# Patient Record
Sex: Female | Born: 1940 | Race: White | Hispanic: No | Marital: Married
Health system: Southern US, Community
[De-identification: ages and names within clinical notes are randomized; demographics above are authoritative.]

---

## 2000-06-20 ENCOUNTER — Other Ambulatory Visit: Admission: RE | Admit: 2000-06-20 | Discharge: 2000-06-20 | Payer: Self-pay | Admitting: Obstetrics and Gynecology

## 2000-07-04 ENCOUNTER — Other Ambulatory Visit: Admission: RE | Admit: 2000-07-04 | Discharge: 2000-07-04 | Payer: Self-pay | Admitting: Obstetrics and Gynecology

## 2000-09-20 ENCOUNTER — Other Ambulatory Visit: Admission: RE | Admit: 2000-09-20 | Discharge: 2000-09-20 | Payer: Self-pay | Admitting: Obstetrics and Gynecology

## 2000-12-29 ENCOUNTER — Encounter: Admission: RE | Admit: 2000-12-29 | Discharge: 2000-12-29 | Payer: Self-pay | Admitting: *Deleted

## 2000-12-29 ENCOUNTER — Encounter: Payer: Self-pay | Admitting: *Deleted

## 2001-04-05 ENCOUNTER — Other Ambulatory Visit: Admission: RE | Admit: 2001-04-05 | Discharge: 2001-04-05 | Payer: Self-pay | Admitting: Obstetrics and Gynecology

## 2001-10-15 ENCOUNTER — Other Ambulatory Visit: Admission: RE | Admit: 2001-10-15 | Discharge: 2001-10-15 | Payer: Self-pay | Admitting: Obstetrics and Gynecology

## 2002-04-26 ENCOUNTER — Other Ambulatory Visit: Admission: RE | Admit: 2002-04-26 | Discharge: 2002-04-26 | Payer: Self-pay | Admitting: Obstetrics and Gynecology

## 2002-05-15 ENCOUNTER — Ambulatory Visit (HOSPITAL_COMMUNITY): Admission: RE | Admit: 2002-05-15 | Discharge: 2002-05-15 | Payer: Self-pay | Admitting: Gastroenterology

## 2002-08-14 ENCOUNTER — Ambulatory Visit (HOSPITAL_COMMUNITY): Admission: RE | Admit: 2002-08-14 | Discharge: 2002-08-14 | Payer: Self-pay | Admitting: Gastroenterology

## 2002-10-17 ENCOUNTER — Other Ambulatory Visit: Admission: RE | Admit: 2002-10-17 | Discharge: 2002-10-17 | Payer: Self-pay | Admitting: Obstetrics and Gynecology

## 2004-03-08 ENCOUNTER — Other Ambulatory Visit: Admission: RE | Admit: 2004-03-08 | Discharge: 2004-03-08 | Payer: Self-pay | Admitting: Obstetrics and Gynecology

## 2005-03-10 ENCOUNTER — Other Ambulatory Visit: Admission: RE | Admit: 2005-03-10 | Discharge: 2005-03-10 | Payer: Self-pay | Admitting: Obstetrics and Gynecology

## 2005-05-18 ENCOUNTER — Encounter: Admission: RE | Admit: 2005-05-18 | Discharge: 2005-05-18 | Payer: Self-pay | Admitting: Obstetrics and Gynecology

## 2005-06-06 ENCOUNTER — Encounter: Admission: RE | Admit: 2005-06-06 | Discharge: 2005-06-06 | Payer: Self-pay | Admitting: Family Medicine

## 2005-07-30 ENCOUNTER — Emergency Department (HOSPITAL_COMMUNITY): Admission: EM | Admit: 2005-07-30 | Discharge: 2005-07-30 | Payer: Self-pay | Admitting: Family Medicine

## 2005-07-31 ENCOUNTER — Emergency Department (HOSPITAL_COMMUNITY): Admission: EM | Admit: 2005-07-31 | Discharge: 2005-07-31 | Payer: Self-pay | Admitting: Family Medicine

## 2011-04-21 DIAGNOSIS — L259 Unspecified contact dermatitis, unspecified cause: Secondary | ICD-10-CM | POA: Diagnosis not present

## 2011-05-10 DIAGNOSIS — R1013 Epigastric pain: Secondary | ICD-10-CM | POA: Diagnosis not present

## 2011-05-10 DIAGNOSIS — K5900 Constipation, unspecified: Secondary | ICD-10-CM | POA: Diagnosis not present

## 2011-05-10 DIAGNOSIS — K219 Gastro-esophageal reflux disease without esophagitis: Secondary | ICD-10-CM | POA: Diagnosis not present

## 2011-05-31 DIAGNOSIS — S0180XA Unspecified open wound of other part of head, initial encounter: Secondary | ICD-10-CM | POA: Diagnosis not present

## 2011-05-31 DIAGNOSIS — S0990XA Unspecified injury of head, initial encounter: Secondary | ICD-10-CM | POA: Diagnosis not present

## 2011-06-06 DIAGNOSIS — S1093XA Contusion of unspecified part of neck, initial encounter: Secondary | ICD-10-CM | POA: Diagnosis not present

## 2011-06-06 DIAGNOSIS — S0003XA Contusion of scalp, initial encounter: Secondary | ICD-10-CM | POA: Diagnosis not present

## 2011-06-06 DIAGNOSIS — T148XXA Other injury of unspecified body region, initial encounter: Secondary | ICD-10-CM | POA: Diagnosis not present

## 2011-06-08 DIAGNOSIS — H04129 Dry eye syndrome of unspecified lacrimal gland: Secondary | ICD-10-CM | POA: Diagnosis not present

## 2011-06-08 DIAGNOSIS — H00029 Hordeolum internum unspecified eye, unspecified eyelid: Secondary | ICD-10-CM | POA: Diagnosis not present

## 2011-06-08 DIAGNOSIS — H40029 Open angle with borderline findings, high risk, unspecified eye: Secondary | ICD-10-CM | POA: Diagnosis not present

## 2011-08-08 DIAGNOSIS — I10 Essential (primary) hypertension: Secondary | ICD-10-CM | POA: Diagnosis not present

## 2011-08-08 DIAGNOSIS — E7800 Pure hypercholesterolemia, unspecified: Secondary | ICD-10-CM | POA: Diagnosis not present

## 2011-08-08 DIAGNOSIS — E039 Hypothyroidism, unspecified: Secondary | ICD-10-CM | POA: Diagnosis not present

## 2011-08-26 DIAGNOSIS — R928 Other abnormal and inconclusive findings on diagnostic imaging of breast: Secondary | ICD-10-CM | POA: Diagnosis not present

## 2011-10-07 DIAGNOSIS — D239 Other benign neoplasm of skin, unspecified: Secondary | ICD-10-CM | POA: Diagnosis not present

## 2011-10-07 DIAGNOSIS — L821 Other seborrheic keratosis: Secondary | ICD-10-CM | POA: Diagnosis not present

## 2011-10-07 DIAGNOSIS — L723 Sebaceous cyst: Secondary | ICD-10-CM | POA: Diagnosis not present

## 2011-10-07 DIAGNOSIS — L719 Rosacea, unspecified: Secondary | ICD-10-CM | POA: Diagnosis not present

## 2011-11-15 DIAGNOSIS — K5900 Constipation, unspecified: Secondary | ICD-10-CM | POA: Diagnosis not present

## 2011-11-15 DIAGNOSIS — Z1211 Encounter for screening for malignant neoplasm of colon: Secondary | ICD-10-CM | POA: Diagnosis not present

## 2011-11-15 DIAGNOSIS — K219 Gastro-esophageal reflux disease without esophagitis: Secondary | ICD-10-CM | POA: Diagnosis not present

## 2011-12-22 DIAGNOSIS — H35379 Puckering of macula, unspecified eye: Secondary | ICD-10-CM | POA: Diagnosis not present

## 2011-12-22 DIAGNOSIS — H40019 Open angle with borderline findings, low risk, unspecified eye: Secondary | ICD-10-CM | POA: Diagnosis not present

## 2011-12-22 DIAGNOSIS — H2510 Age-related nuclear cataract, unspecified eye: Secondary | ICD-10-CM | POA: Diagnosis not present

## 2011-12-22 DIAGNOSIS — H04129 Dry eye syndrome of unspecified lacrimal gland: Secondary | ICD-10-CM | POA: Diagnosis not present

## 2012-01-18 DIAGNOSIS — Z86010 Personal history of colonic polyps: Secondary | ICD-10-CM | POA: Diagnosis not present

## 2012-01-18 DIAGNOSIS — Z800 Family history of malignant neoplasm of digestive organs: Secondary | ICD-10-CM | POA: Diagnosis not present

## 2012-01-18 DIAGNOSIS — Z1211 Encounter for screening for malignant neoplasm of colon: Secondary | ICD-10-CM | POA: Diagnosis not present

## 2012-01-18 DIAGNOSIS — D126 Benign neoplasm of colon, unspecified: Secondary | ICD-10-CM | POA: Diagnosis not present

## 2012-02-01 DIAGNOSIS — L259 Unspecified contact dermatitis, unspecified cause: Secondary | ICD-10-CM | POA: Diagnosis not present

## 2012-02-01 DIAGNOSIS — D485 Neoplasm of uncertain behavior of skin: Secondary | ICD-10-CM | POA: Diagnosis not present

## 2012-02-14 DIAGNOSIS — E7800 Pure hypercholesterolemia, unspecified: Secondary | ICD-10-CM | POA: Diagnosis not present

## 2012-02-14 DIAGNOSIS — I10 Essential (primary) hypertension: Secondary | ICD-10-CM | POA: Diagnosis not present

## 2012-02-14 DIAGNOSIS — Z23 Encounter for immunization: Secondary | ICD-10-CM | POA: Diagnosis not present

## 2012-02-14 DIAGNOSIS — E039 Hypothyroidism, unspecified: Secondary | ICD-10-CM | POA: Diagnosis not present

## 2012-02-14 DIAGNOSIS — M810 Age-related osteoporosis without current pathological fracture: Secondary | ICD-10-CM | POA: Diagnosis not present

## 2012-02-14 DIAGNOSIS — J309 Allergic rhinitis, unspecified: Secondary | ICD-10-CM | POA: Diagnosis not present

## 2012-02-14 DIAGNOSIS — D126 Benign neoplasm of colon, unspecified: Secondary | ICD-10-CM | POA: Diagnosis not present

## 2012-02-14 DIAGNOSIS — Z0000 Encounter for general adult medical examination without abnormal findings: Secondary | ICD-10-CM | POA: Diagnosis not present

## 2012-06-08 DIAGNOSIS — H40019 Open angle with borderline findings, low risk, unspecified eye: Secondary | ICD-10-CM | POA: Diagnosis not present

## 2012-06-08 DIAGNOSIS — H04129 Dry eye syndrome of unspecified lacrimal gland: Secondary | ICD-10-CM | POA: Diagnosis not present

## 2012-06-13 DIAGNOSIS — M545 Low back pain, unspecified: Secondary | ICD-10-CM | POA: Diagnosis not present

## 2012-06-13 DIAGNOSIS — E039 Hypothyroidism, unspecified: Secondary | ICD-10-CM | POA: Diagnosis not present

## 2012-06-13 DIAGNOSIS — E7800 Pure hypercholesterolemia, unspecified: Secondary | ICD-10-CM | POA: Diagnosis not present

## 2012-06-13 DIAGNOSIS — K120 Recurrent oral aphthae: Secondary | ICD-10-CM | POA: Diagnosis not present

## 2012-06-13 DIAGNOSIS — I10 Essential (primary) hypertension: Secondary | ICD-10-CM | POA: Diagnosis not present

## 2012-08-14 DIAGNOSIS — I10 Essential (primary) hypertension: Secondary | ICD-10-CM | POA: Diagnosis not present

## 2012-08-14 DIAGNOSIS — E039 Hypothyroidism, unspecified: Secondary | ICD-10-CM | POA: Diagnosis not present

## 2012-08-14 DIAGNOSIS — E7800 Pure hypercholesterolemia, unspecified: Secondary | ICD-10-CM | POA: Diagnosis not present

## 2012-08-29 DIAGNOSIS — Z1231 Encounter for screening mammogram for malignant neoplasm of breast: Secondary | ICD-10-CM | POA: Diagnosis not present

## 2012-09-03 DIAGNOSIS — L259 Unspecified contact dermatitis, unspecified cause: Secondary | ICD-10-CM | POA: Diagnosis not present

## 2012-09-10 DIAGNOSIS — L719 Rosacea, unspecified: Secondary | ICD-10-CM | POA: Diagnosis not present

## 2012-11-14 DIAGNOSIS — L719 Rosacea, unspecified: Secondary | ICD-10-CM | POA: Diagnosis not present

## 2012-11-14 DIAGNOSIS — L821 Other seborrheic keratosis: Secondary | ICD-10-CM | POA: Diagnosis not present

## 2012-11-14 DIAGNOSIS — D239 Other benign neoplasm of skin, unspecified: Secondary | ICD-10-CM | POA: Diagnosis not present

## 2012-11-14 DIAGNOSIS — L723 Sebaceous cyst: Secondary | ICD-10-CM | POA: Diagnosis not present

## 2012-11-14 DIAGNOSIS — D2370 Other benign neoplasm of skin of unspecified lower limb, including hip: Secondary | ICD-10-CM | POA: Diagnosis not present

## 2012-12-13 DIAGNOSIS — K219 Gastro-esophageal reflux disease without esophagitis: Secondary | ICD-10-CM | POA: Diagnosis not present

## 2012-12-13 DIAGNOSIS — K5900 Constipation, unspecified: Secondary | ICD-10-CM | POA: Diagnosis not present

## 2012-12-31 DIAGNOSIS — H04129 Dry eye syndrome of unspecified lacrimal gland: Secondary | ICD-10-CM | POA: Diagnosis not present

## 2012-12-31 DIAGNOSIS — D492 Neoplasm of unspecified behavior of bone, soft tissue, and skin: Secondary | ICD-10-CM | POA: Diagnosis not present

## 2012-12-31 DIAGNOSIS — H2510 Age-related nuclear cataract, unspecified eye: Secondary | ICD-10-CM | POA: Diagnosis not present

## 2012-12-31 DIAGNOSIS — H35379 Puckering of macula, unspecified eye: Secondary | ICD-10-CM | POA: Diagnosis not present

## 2012-12-31 DIAGNOSIS — H524 Presbyopia: Secondary | ICD-10-CM | POA: Diagnosis not present

## 2012-12-31 DIAGNOSIS — H40029 Open angle with borderline findings, high risk, unspecified eye: Secondary | ICD-10-CM | POA: Diagnosis not present

## 2012-12-31 DIAGNOSIS — H43819 Vitreous degeneration, unspecified eye: Secondary | ICD-10-CM | POA: Diagnosis not present

## 2013-01-15 DIAGNOSIS — Z23 Encounter for immunization: Secondary | ICD-10-CM | POA: Diagnosis not present

## 2013-01-31 DIAGNOSIS — R6889 Other general symptoms and signs: Secondary | ICD-10-CM | POA: Diagnosis not present

## 2013-01-31 DIAGNOSIS — R1310 Dysphagia, unspecified: Secondary | ICD-10-CM | POA: Diagnosis not present

## 2013-02-05 DIAGNOSIS — J3501 Chronic tonsillitis: Secondary | ICD-10-CM | POA: Diagnosis not present

## 2013-02-05 DIAGNOSIS — K219 Gastro-esophageal reflux disease without esophagitis: Secondary | ICD-10-CM | POA: Diagnosis not present

## 2013-02-11 DIAGNOSIS — M461 Sacroiliitis, not elsewhere classified: Secondary | ICD-10-CM | POA: Diagnosis not present

## 2013-02-20 DIAGNOSIS — I10 Essential (primary) hypertension: Secondary | ICD-10-CM | POA: Diagnosis not present

## 2013-02-20 DIAGNOSIS — D126 Benign neoplasm of colon, unspecified: Secondary | ICD-10-CM | POA: Diagnosis not present

## 2013-02-20 DIAGNOSIS — M810 Age-related osteoporosis without current pathological fracture: Secondary | ICD-10-CM | POA: Diagnosis not present

## 2013-02-20 DIAGNOSIS — E039 Hypothyroidism, unspecified: Secondary | ICD-10-CM | POA: Diagnosis not present

## 2013-02-20 DIAGNOSIS — J309 Allergic rhinitis, unspecified: Secondary | ICD-10-CM | POA: Diagnosis not present

## 2013-02-20 DIAGNOSIS — Z0000 Encounter for general adult medical examination without abnormal findings: Secondary | ICD-10-CM | POA: Diagnosis not present

## 2013-02-20 DIAGNOSIS — M25559 Pain in unspecified hip: Secondary | ICD-10-CM | POA: Diagnosis not present

## 2013-02-20 DIAGNOSIS — E7800 Pure hypercholesterolemia, unspecified: Secondary | ICD-10-CM | POA: Diagnosis not present

## 2013-03-05 DIAGNOSIS — M76899 Other specified enthesopathies of unspecified lower limb, excluding foot: Secondary | ICD-10-CM | POA: Diagnosis not present

## 2013-03-27 DIAGNOSIS — M810 Age-related osteoporosis without current pathological fracture: Secondary | ICD-10-CM | POA: Diagnosis not present

## 2013-05-14 ENCOUNTER — Other Ambulatory Visit: Payer: Self-pay | Admitting: Dermatology

## 2013-05-14 DIAGNOSIS — D485 Neoplasm of uncertain behavior of skin: Secondary | ICD-10-CM | POA: Diagnosis not present

## 2013-05-14 DIAGNOSIS — L723 Sebaceous cyst: Secondary | ICD-10-CM | POA: Diagnosis not present

## 2013-05-30 DIAGNOSIS — H40029 Open angle with borderline findings, high risk, unspecified eye: Secondary | ICD-10-CM | POA: Diagnosis not present

## 2013-08-07 DIAGNOSIS — K219 Gastro-esophageal reflux disease without esophagitis: Secondary | ICD-10-CM | POA: Diagnosis not present

## 2013-08-07 DIAGNOSIS — M949 Disorder of cartilage, unspecified: Secondary | ICD-10-CM | POA: Diagnosis not present

## 2013-08-07 DIAGNOSIS — M899 Disorder of bone, unspecified: Secondary | ICD-10-CM | POA: Diagnosis not present

## 2013-08-09 DIAGNOSIS — M899 Disorder of bone, unspecified: Secondary | ICD-10-CM | POA: Diagnosis not present

## 2013-08-09 DIAGNOSIS — T148XXA Other injury of unspecified body region, initial encounter: Secondary | ICD-10-CM | POA: Diagnosis not present

## 2013-08-15 ENCOUNTER — Ambulatory Visit: Payer: Medicare Other | Attending: Internal Medicine | Admitting: Physical Therapy

## 2013-08-15 DIAGNOSIS — IMO0001 Reserved for inherently not codable concepts without codable children: Secondary | ICD-10-CM | POA: Insufficient documentation

## 2013-08-15 DIAGNOSIS — M545 Low back pain, unspecified: Secondary | ICD-10-CM | POA: Diagnosis not present

## 2013-08-15 DIAGNOSIS — R5381 Other malaise: Secondary | ICD-10-CM | POA: Insufficient documentation

## 2013-08-21 DIAGNOSIS — E7800 Pure hypercholesterolemia, unspecified: Secondary | ICD-10-CM | POA: Diagnosis not present

## 2013-08-21 DIAGNOSIS — M810 Age-related osteoporosis without current pathological fracture: Secondary | ICD-10-CM | POA: Diagnosis not present

## 2013-08-21 DIAGNOSIS — K219 Gastro-esophageal reflux disease without esophagitis: Secondary | ICD-10-CM | POA: Diagnosis not present

## 2013-08-21 DIAGNOSIS — I10 Essential (primary) hypertension: Secondary | ICD-10-CM | POA: Diagnosis not present

## 2013-08-21 DIAGNOSIS — E039 Hypothyroidism, unspecified: Secondary | ICD-10-CM | POA: Diagnosis not present

## 2013-08-22 ENCOUNTER — Ambulatory Visit: Payer: Medicare Other | Attending: Internal Medicine | Admitting: Physical Therapy

## 2013-08-22 DIAGNOSIS — M545 Low back pain, unspecified: Secondary | ICD-10-CM | POA: Diagnosis not present

## 2013-08-22 DIAGNOSIS — R5381 Other malaise: Secondary | ICD-10-CM | POA: Diagnosis not present

## 2013-08-22 DIAGNOSIS — IMO0001 Reserved for inherently not codable concepts without codable children: Secondary | ICD-10-CM | POA: Diagnosis not present

## 2013-08-28 ENCOUNTER — Encounter (HOSPITAL_COMMUNITY): Payer: Self-pay

## 2013-08-29 ENCOUNTER — Ambulatory Visit: Payer: Medicare Other | Admitting: Physical Therapy

## 2013-08-29 DIAGNOSIS — M545 Low back pain, unspecified: Secondary | ICD-10-CM | POA: Diagnosis not present

## 2013-08-29 DIAGNOSIS — R5381 Other malaise: Secondary | ICD-10-CM | POA: Diagnosis not present

## 2013-08-29 DIAGNOSIS — IMO0001 Reserved for inherently not codable concepts without codable children: Secondary | ICD-10-CM | POA: Diagnosis not present

## 2013-09-04 DIAGNOSIS — Z1231 Encounter for screening mammogram for malignant neoplasm of breast: Secondary | ICD-10-CM | POA: Diagnosis not present

## 2013-09-05 ENCOUNTER — Ambulatory Visit: Payer: Medicare Other | Admitting: Physical Therapy

## 2013-09-05 DIAGNOSIS — M545 Low back pain, unspecified: Secondary | ICD-10-CM | POA: Diagnosis not present

## 2013-09-05 DIAGNOSIS — IMO0001 Reserved for inherently not codable concepts without codable children: Secondary | ICD-10-CM | POA: Diagnosis not present

## 2013-09-05 DIAGNOSIS — R5381 Other malaise: Secondary | ICD-10-CM | POA: Diagnosis not present

## 2013-11-26 DIAGNOSIS — H40029 Open angle with borderline findings, high risk, unspecified eye: Secondary | ICD-10-CM | POA: Diagnosis not present

## 2013-11-28 DIAGNOSIS — D239 Other benign neoplasm of skin, unspecified: Secondary | ICD-10-CM | POA: Diagnosis not present

## 2013-11-28 DIAGNOSIS — D485 Neoplasm of uncertain behavior of skin: Secondary | ICD-10-CM | POA: Diagnosis not present

## 2013-11-28 DIAGNOSIS — L821 Other seborrheic keratosis: Secondary | ICD-10-CM | POA: Diagnosis not present

## 2014-01-01 DIAGNOSIS — H04123 Dry eye syndrome of bilateral lacrimal glands: Secondary | ICD-10-CM | POA: Diagnosis not present

## 2014-01-01 DIAGNOSIS — H524 Presbyopia: Secondary | ICD-10-CM | POA: Diagnosis not present

## 2014-01-01 DIAGNOSIS — H2513 Age-related nuclear cataract, bilateral: Secondary | ICD-10-CM | POA: Diagnosis not present

## 2014-01-01 DIAGNOSIS — H40023 Open angle with borderline findings, high risk, bilateral: Secondary | ICD-10-CM | POA: Diagnosis not present

## 2014-01-02 ENCOUNTER — Other Ambulatory Visit: Payer: Self-pay | Admitting: Dermatology

## 2014-01-02 DIAGNOSIS — D2371 Other benign neoplasm of skin of right lower limb, including hip: Secondary | ICD-10-CM | POA: Diagnosis not present

## 2014-01-02 DIAGNOSIS — D489 Neoplasm of uncertain behavior, unspecified: Secondary | ICD-10-CM | POA: Diagnosis not present

## 2014-03-06 DIAGNOSIS — Z0000 Encounter for general adult medical examination without abnormal findings: Secondary | ICD-10-CM | POA: Diagnosis not present

## 2014-03-06 DIAGNOSIS — M810 Age-related osteoporosis without current pathological fracture: Secondary | ICD-10-CM | POA: Diagnosis not present

## 2014-03-06 DIAGNOSIS — E039 Hypothyroidism, unspecified: Secondary | ICD-10-CM | POA: Diagnosis not present

## 2014-03-06 DIAGNOSIS — I10 Essential (primary) hypertension: Secondary | ICD-10-CM | POA: Diagnosis not present

## 2014-03-06 DIAGNOSIS — E780 Pure hypercholesterolemia: Secondary | ICD-10-CM | POA: Diagnosis not present

## 2014-03-06 DIAGNOSIS — Z1389 Encounter for screening for other disorder: Secondary | ICD-10-CM | POA: Diagnosis not present

## 2014-03-06 DIAGNOSIS — K219 Gastro-esophageal reflux disease without esophagitis: Secondary | ICD-10-CM | POA: Diagnosis not present

## 2014-03-06 DIAGNOSIS — Z23 Encounter for immunization: Secondary | ICD-10-CM | POA: Diagnosis not present

## 2014-03-06 DIAGNOSIS — K635 Polyp of colon: Secondary | ICD-10-CM | POA: Diagnosis not present

## 2014-03-06 DIAGNOSIS — J309 Allergic rhinitis, unspecified: Secondary | ICD-10-CM | POA: Diagnosis not present

## 2014-08-11 DIAGNOSIS — K219 Gastro-esophageal reflux disease without esophagitis: Secondary | ICD-10-CM | POA: Diagnosis not present

## 2014-08-11 DIAGNOSIS — M859 Disorder of bone density and structure, unspecified: Secondary | ICD-10-CM | POA: Diagnosis not present

## 2014-09-02 DIAGNOSIS — I10 Essential (primary) hypertension: Secondary | ICD-10-CM | POA: Diagnosis not present

## 2014-09-02 DIAGNOSIS — M810 Age-related osteoporosis without current pathological fracture: Secondary | ICD-10-CM | POA: Diagnosis not present

## 2014-09-02 DIAGNOSIS — E780 Pure hypercholesterolemia: Secondary | ICD-10-CM | POA: Diagnosis not present

## 2014-09-02 DIAGNOSIS — E039 Hypothyroidism, unspecified: Secondary | ICD-10-CM | POA: Diagnosis not present

## 2014-09-29 DIAGNOSIS — Z1231 Encounter for screening mammogram for malignant neoplasm of breast: Secondary | ICD-10-CM | POA: Diagnosis not present

## 2014-12-04 DIAGNOSIS — H40023 Open angle with borderline findings, high risk, bilateral: Secondary | ICD-10-CM | POA: Diagnosis not present

## 2014-12-12 DIAGNOSIS — D1722 Benign lipomatous neoplasm of skin and subcutaneous tissue of left arm: Secondary | ICD-10-CM | POA: Diagnosis not present

## 2014-12-12 DIAGNOSIS — L570 Actinic keratosis: Secondary | ICD-10-CM | POA: Diagnosis not present

## 2014-12-12 DIAGNOSIS — L719 Rosacea, unspecified: Secondary | ICD-10-CM | POA: Diagnosis not present

## 2014-12-12 DIAGNOSIS — L821 Other seborrheic keratosis: Secondary | ICD-10-CM | POA: Diagnosis not present

## 2014-12-12 DIAGNOSIS — D2261 Melanocytic nevi of right upper limb, including shoulder: Secondary | ICD-10-CM | POA: Diagnosis not present

## 2015-01-06 DIAGNOSIS — H2513 Age-related nuclear cataract, bilateral: Secondary | ICD-10-CM | POA: Diagnosis not present

## 2015-01-06 DIAGNOSIS — H35373 Puckering of macula, bilateral: Secondary | ICD-10-CM | POA: Diagnosis not present

## 2015-01-06 DIAGNOSIS — H40023 Open angle with borderline findings, high risk, bilateral: Secondary | ICD-10-CM | POA: Diagnosis not present

## 2015-01-06 DIAGNOSIS — H04123 Dry eye syndrome of bilateral lacrimal glands: Secondary | ICD-10-CM | POA: Diagnosis not present

## 2015-03-11 DIAGNOSIS — E7800 Pure hypercholesterolemia, unspecified: Secondary | ICD-10-CM | POA: Diagnosis not present

## 2015-03-11 DIAGNOSIS — M810 Age-related osteoporosis without current pathological fracture: Secondary | ICD-10-CM | POA: Diagnosis not present

## 2015-03-11 DIAGNOSIS — J309 Allergic rhinitis, unspecified: Secondary | ICD-10-CM | POA: Diagnosis not present

## 2015-03-11 DIAGNOSIS — E039 Hypothyroidism, unspecified: Secondary | ICD-10-CM | POA: Diagnosis not present

## 2015-03-11 DIAGNOSIS — Z0000 Encounter for general adult medical examination without abnormal findings: Secondary | ICD-10-CM | POA: Diagnosis not present

## 2015-03-11 DIAGNOSIS — L719 Rosacea, unspecified: Secondary | ICD-10-CM | POA: Diagnosis not present

## 2015-03-11 DIAGNOSIS — I10 Essential (primary) hypertension: Secondary | ICD-10-CM | POA: Diagnosis not present

## 2015-03-11 DIAGNOSIS — K219 Gastro-esophageal reflux disease without esophagitis: Secondary | ICD-10-CM | POA: Diagnosis not present

## 2015-03-11 DIAGNOSIS — Z23 Encounter for immunization: Secondary | ICD-10-CM | POA: Diagnosis not present

## 2015-03-11 DIAGNOSIS — Z1389 Encounter for screening for other disorder: Secondary | ICD-10-CM | POA: Diagnosis not present

## 2015-04-08 DIAGNOSIS — M8589 Other specified disorders of bone density and structure, multiple sites: Secondary | ICD-10-CM | POA: Diagnosis not present

## 2015-04-08 DIAGNOSIS — M859 Disorder of bone density and structure, unspecified: Secondary | ICD-10-CM | POA: Diagnosis not present

## 2015-04-17 DIAGNOSIS — M8580 Other specified disorders of bone density and structure, unspecified site: Secondary | ICD-10-CM | POA: Diagnosis not present

## 2015-06-04 DIAGNOSIS — R194 Change in bowel habit: Secondary | ICD-10-CM | POA: Diagnosis not present

## 2015-06-04 DIAGNOSIS — Z800 Family history of malignant neoplasm of digestive organs: Secondary | ICD-10-CM | POA: Diagnosis not present

## 2015-06-04 DIAGNOSIS — Z1211 Encounter for screening for malignant neoplasm of colon: Secondary | ICD-10-CM | POA: Diagnosis not present

## 2015-06-08 DIAGNOSIS — K635 Polyp of colon: Secondary | ICD-10-CM | POA: Diagnosis not present

## 2015-06-08 DIAGNOSIS — D120 Benign neoplasm of cecum: Secondary | ICD-10-CM | POA: Diagnosis not present

## 2015-06-08 DIAGNOSIS — Z1211 Encounter for screening for malignant neoplasm of colon: Secondary | ICD-10-CM | POA: Diagnosis not present

## 2015-06-08 DIAGNOSIS — R194 Change in bowel habit: Secondary | ICD-10-CM | POA: Diagnosis not present

## 2015-06-25 DIAGNOSIS — R194 Change in bowel habit: Secondary | ICD-10-CM | POA: Diagnosis not present

## 2015-06-25 DIAGNOSIS — K219 Gastro-esophageal reflux disease without esophagitis: Secondary | ICD-10-CM | POA: Diagnosis not present

## 2015-06-25 DIAGNOSIS — Z86010 Personal history of colonic polyps: Secondary | ICD-10-CM | POA: Diagnosis not present

## 2015-08-27 DIAGNOSIS — L820 Inflamed seborrheic keratosis: Secondary | ICD-10-CM | POA: Diagnosis not present

## 2015-08-27 DIAGNOSIS — M25559 Pain in unspecified hip: Secondary | ICD-10-CM | POA: Diagnosis not present

## 2015-08-27 DIAGNOSIS — L821 Other seborrheic keratosis: Secondary | ICD-10-CM | POA: Diagnosis not present

## 2015-08-27 DIAGNOSIS — I781 Nevus, non-neoplastic: Secondary | ICD-10-CM | POA: Diagnosis not present

## 2015-08-27 DIAGNOSIS — D485 Neoplasm of uncertain behavior of skin: Secondary | ICD-10-CM | POA: Diagnosis not present

## 2015-08-31 DIAGNOSIS — M25552 Pain in left hip: Secondary | ICD-10-CM | POA: Diagnosis not present

## 2015-09-15 DIAGNOSIS — E7800 Pure hypercholesterolemia, unspecified: Secondary | ICD-10-CM | POA: Diagnosis not present

## 2015-09-15 DIAGNOSIS — E039 Hypothyroidism, unspecified: Secondary | ICD-10-CM | POA: Diagnosis not present

## 2015-09-15 DIAGNOSIS — M7070 Other bursitis of hip, unspecified hip: Secondary | ICD-10-CM | POA: Diagnosis not present

## 2015-09-15 DIAGNOSIS — I10 Essential (primary) hypertension: Secondary | ICD-10-CM | POA: Diagnosis not present

## 2015-09-28 DIAGNOSIS — M25552 Pain in left hip: Secondary | ICD-10-CM | POA: Diagnosis not present

## 2015-09-30 DIAGNOSIS — Z1231 Encounter for screening mammogram for malignant neoplasm of breast: Secondary | ICD-10-CM | POA: Diagnosis not present

## 2015-10-15 DIAGNOSIS — M25552 Pain in left hip: Secondary | ICD-10-CM | POA: Diagnosis not present

## 2015-10-29 DIAGNOSIS — M25552 Pain in left hip: Secondary | ICD-10-CM | POA: Diagnosis not present

## 2015-11-12 DIAGNOSIS — M25552 Pain in left hip: Secondary | ICD-10-CM | POA: Diagnosis not present

## 2015-12-17 DIAGNOSIS — L719 Rosacea, unspecified: Secondary | ICD-10-CM | POA: Diagnosis not present

## 2015-12-17 DIAGNOSIS — L821 Other seborrheic keratosis: Secondary | ICD-10-CM | POA: Diagnosis not present

## 2015-12-17 DIAGNOSIS — D2261 Melanocytic nevi of right upper limb, including shoulder: Secondary | ICD-10-CM | POA: Diagnosis not present

## 2015-12-17 DIAGNOSIS — D2262 Melanocytic nevi of left upper limb, including shoulder: Secondary | ICD-10-CM | POA: Diagnosis not present

## 2015-12-17 DIAGNOSIS — Z23 Encounter for immunization: Secondary | ICD-10-CM | POA: Diagnosis not present

## 2015-12-17 DIAGNOSIS — L820 Inflamed seborrheic keratosis: Secondary | ICD-10-CM | POA: Diagnosis not present

## 2016-01-14 DIAGNOSIS — H40023 Open angle with borderline findings, high risk, bilateral: Secondary | ICD-10-CM | POA: Diagnosis not present

## 2016-01-14 DIAGNOSIS — H04123 Dry eye syndrome of bilateral lacrimal glands: Secondary | ICD-10-CM | POA: Diagnosis not present

## 2016-01-14 DIAGNOSIS — H35373 Puckering of macula, bilateral: Secondary | ICD-10-CM | POA: Diagnosis not present

## 2016-01-14 DIAGNOSIS — H2513 Age-related nuclear cataract, bilateral: Secondary | ICD-10-CM | POA: Diagnosis not present

## 2016-01-14 DIAGNOSIS — H43822 Vitreomacular adhesion, left eye: Secondary | ICD-10-CM | POA: Diagnosis not present

## 2016-01-22 DIAGNOSIS — Z23 Encounter for immunization: Secondary | ICD-10-CM | POA: Diagnosis not present

## 2016-02-09 DIAGNOSIS — M25552 Pain in left hip: Secondary | ICD-10-CM | POA: Diagnosis not present

## 2016-03-11 DIAGNOSIS — R42 Dizziness and giddiness: Secondary | ICD-10-CM | POA: Diagnosis not present

## 2016-03-24 DIAGNOSIS — E039 Hypothyroidism, unspecified: Secondary | ICD-10-CM | POA: Diagnosis not present

## 2016-03-24 DIAGNOSIS — J309 Allergic rhinitis, unspecified: Secondary | ICD-10-CM | POA: Diagnosis not present

## 2016-03-24 DIAGNOSIS — M810 Age-related osteoporosis without current pathological fracture: Secondary | ICD-10-CM | POA: Diagnosis not present

## 2016-03-24 DIAGNOSIS — Z0000 Encounter for general adult medical examination without abnormal findings: Secondary | ICD-10-CM | POA: Diagnosis not present

## 2016-03-24 DIAGNOSIS — I10 Essential (primary) hypertension: Secondary | ICD-10-CM | POA: Diagnosis not present

## 2016-03-24 DIAGNOSIS — K219 Gastro-esophageal reflux disease without esophagitis: Secondary | ICD-10-CM | POA: Diagnosis not present

## 2016-03-24 DIAGNOSIS — L719 Rosacea, unspecified: Secondary | ICD-10-CM | POA: Diagnosis not present

## 2016-03-24 DIAGNOSIS — E7800 Pure hypercholesterolemia, unspecified: Secondary | ICD-10-CM | POA: Diagnosis not present

## 2016-03-24 DIAGNOSIS — R42 Dizziness and giddiness: Secondary | ICD-10-CM | POA: Diagnosis not present

## 2016-04-25 DIAGNOSIS — J069 Acute upper respiratory infection, unspecified: Secondary | ICD-10-CM | POA: Diagnosis not present

## 2016-04-25 DIAGNOSIS — R509 Fever, unspecified: Secondary | ICD-10-CM | POA: Diagnosis not present

## 2016-04-25 DIAGNOSIS — R05 Cough: Secondary | ICD-10-CM | POA: Diagnosis not present

## 2016-05-04 DIAGNOSIS — M8580 Other specified disorders of bone density and structure, unspecified site: Secondary | ICD-10-CM | POA: Diagnosis not present

## 2016-05-12 DIAGNOSIS — M5430 Sciatica, unspecified side: Secondary | ICD-10-CM | POA: Diagnosis not present

## 2016-05-12 DIAGNOSIS — E7800 Pure hypercholesterolemia, unspecified: Secondary | ICD-10-CM | POA: Diagnosis not present

## 2016-06-14 DIAGNOSIS — Z86010 Personal history of colonic polyps: Secondary | ICD-10-CM | POA: Diagnosis not present

## 2016-06-14 DIAGNOSIS — Z1211 Encounter for screening for malignant neoplasm of colon: Secondary | ICD-10-CM | POA: Diagnosis not present

## 2016-06-14 DIAGNOSIS — Z800 Family history of malignant neoplasm of digestive organs: Secondary | ICD-10-CM | POA: Diagnosis not present

## 2016-06-14 DIAGNOSIS — K5901 Slow transit constipation: Secondary | ICD-10-CM | POA: Diagnosis not present

## 2016-07-13 DIAGNOSIS — D122 Benign neoplasm of ascending colon: Secondary | ICD-10-CM | POA: Diagnosis not present

## 2016-07-13 DIAGNOSIS — Z86010 Personal history of colonic polyps: Secondary | ICD-10-CM | POA: Diagnosis not present

## 2016-07-13 DIAGNOSIS — Z1211 Encounter for screening for malignant neoplasm of colon: Secondary | ICD-10-CM | POA: Diagnosis not present

## 2016-07-13 DIAGNOSIS — K635 Polyp of colon: Secondary | ICD-10-CM | POA: Diagnosis not present

## 2016-07-13 DIAGNOSIS — K6389 Other specified diseases of intestine: Secondary | ICD-10-CM | POA: Diagnosis not present

## 2016-07-19 DIAGNOSIS — H40023 Open angle with borderline findings, high risk, bilateral: Secondary | ICD-10-CM | POA: Diagnosis not present

## 2016-07-19 DIAGNOSIS — H04123 Dry eye syndrome of bilateral lacrimal glands: Secondary | ICD-10-CM | POA: Diagnosis not present

## 2016-10-05 ENCOUNTER — Other Ambulatory Visit: Payer: Self-pay | Admitting: Family Medicine

## 2016-10-05 DIAGNOSIS — I10 Essential (primary) hypertension: Secondary | ICD-10-CM | POA: Diagnosis not present

## 2016-10-05 DIAGNOSIS — R42 Dizziness and giddiness: Secondary | ICD-10-CM | POA: Diagnosis not present

## 2016-10-05 DIAGNOSIS — E039 Hypothyroidism, unspecified: Secondary | ICD-10-CM | POA: Diagnosis not present

## 2016-10-05 DIAGNOSIS — Z1231 Encounter for screening mammogram for malignant neoplasm of breast: Secondary | ICD-10-CM | POA: Diagnosis not present

## 2016-10-05 DIAGNOSIS — E7800 Pure hypercholesterolemia, unspecified: Secondary | ICD-10-CM | POA: Diagnosis not present

## 2016-10-10 ENCOUNTER — Ambulatory Visit
Admission: RE | Admit: 2016-10-10 | Discharge: 2016-10-10 | Disposition: A | Discharge status: Home / Self Care | Payer: Medicare Other | Source: Ambulatory Visit | Attending: Family Medicine | Admitting: Family Medicine

## 2016-10-10 DIAGNOSIS — I10 Essential (primary) hypertension: Secondary | ICD-10-CM | POA: Diagnosis not present

## 2016-10-10 DIAGNOSIS — R42 Dizziness and giddiness: Secondary | ICD-10-CM

## 2016-11-01 DIAGNOSIS — Z7289 Other problems related to lifestyle: Secondary | ICD-10-CM | POA: Diagnosis not present

## 2016-11-01 DIAGNOSIS — H903 Sensorineural hearing loss, bilateral: Secondary | ICD-10-CM | POA: Diagnosis not present

## 2016-11-01 DIAGNOSIS — R42 Dizziness and giddiness: Secondary | ICD-10-CM | POA: Diagnosis not present

## 2016-12-27 DIAGNOSIS — Z23 Encounter for immunization: Secondary | ICD-10-CM | POA: Diagnosis not present

## 2016-12-27 DIAGNOSIS — D2261 Melanocytic nevi of right upper limb, including shoulder: Secondary | ICD-10-CM | POA: Diagnosis not present

## 2016-12-27 DIAGNOSIS — L821 Other seborrheic keratosis: Secondary | ICD-10-CM | POA: Diagnosis not present

## 2016-12-27 DIAGNOSIS — L820 Inflamed seborrheic keratosis: Secondary | ICD-10-CM | POA: Diagnosis not present

## 2016-12-27 DIAGNOSIS — D1722 Benign lipomatous neoplasm of skin and subcutaneous tissue of left arm: Secondary | ICD-10-CM | POA: Diagnosis not present

## 2017-01-13 DIAGNOSIS — R110 Nausea: Secondary | ICD-10-CM | POA: Diagnosis not present

## 2017-01-17 DIAGNOSIS — K219 Gastro-esophageal reflux disease without esophagitis: Secondary | ICD-10-CM | POA: Diagnosis not present

## 2017-01-17 DIAGNOSIS — Z800 Family history of malignant neoplasm of digestive organs: Secondary | ICD-10-CM | POA: Diagnosis not present

## 2017-01-17 DIAGNOSIS — K921 Melena: Secondary | ICD-10-CM | POA: Diagnosis not present

## 2017-01-17 DIAGNOSIS — Z86010 Personal history of colonic polyps: Secondary | ICD-10-CM | POA: Diagnosis not present

## 2017-01-17 DIAGNOSIS — R1013 Epigastric pain: Secondary | ICD-10-CM | POA: Diagnosis not present

## 2017-01-18 DIAGNOSIS — R1013 Epigastric pain: Secondary | ICD-10-CM | POA: Diagnosis not present

## 2017-01-18 DIAGNOSIS — K269 Duodenal ulcer, unspecified as acute or chronic, without hemorrhage or perforation: Secondary | ICD-10-CM | POA: Diagnosis not present

## 2017-01-18 DIAGNOSIS — K259 Gastric ulcer, unspecified as acute or chronic, without hemorrhage or perforation: Secondary | ICD-10-CM | POA: Diagnosis not present

## 2017-01-18 DIAGNOSIS — K319 Disease of stomach and duodenum, unspecified: Secondary | ICD-10-CM | POA: Diagnosis not present

## 2017-01-18 DIAGNOSIS — K921 Melena: Secondary | ICD-10-CM | POA: Diagnosis not present

## 2017-01-18 DIAGNOSIS — K219 Gastro-esophageal reflux disease without esophagitis: Secondary | ICD-10-CM | POA: Diagnosis not present

## 2017-01-31 DIAGNOSIS — H40023 Open angle with borderline findings, high risk, bilateral: Secondary | ICD-10-CM | POA: Diagnosis not present

## 2017-01-31 DIAGNOSIS — H43822 Vitreomacular adhesion, left eye: Secondary | ICD-10-CM | POA: Diagnosis not present

## 2017-01-31 DIAGNOSIS — H04123 Dry eye syndrome of bilateral lacrimal glands: Secondary | ICD-10-CM | POA: Diagnosis not present

## 2017-01-31 DIAGNOSIS — H2513 Age-related nuclear cataract, bilateral: Secondary | ICD-10-CM | POA: Diagnosis not present

## 2017-02-02 DIAGNOSIS — K219 Gastro-esophageal reflux disease without esophagitis: Secondary | ICD-10-CM | POA: Diagnosis not present

## 2017-02-02 DIAGNOSIS — Z86010 Personal history of colonic polyps: Secondary | ICD-10-CM | POA: Diagnosis not present

## 2017-02-02 DIAGNOSIS — K5901 Slow transit constipation: Secondary | ICD-10-CM | POA: Diagnosis not present

## 2017-02-02 DIAGNOSIS — K269 Duodenal ulcer, unspecified as acute or chronic, without hemorrhage or perforation: Secondary | ICD-10-CM | POA: Diagnosis not present

## 2017-03-28 DIAGNOSIS — M810 Age-related osteoporosis without current pathological fracture: Secondary | ICD-10-CM | POA: Diagnosis not present

## 2017-03-28 DIAGNOSIS — E7800 Pure hypercholesterolemia, unspecified: Secondary | ICD-10-CM | POA: Diagnosis not present

## 2017-03-28 DIAGNOSIS — Z0000 Encounter for general adult medical examination without abnormal findings: Secondary | ICD-10-CM | POA: Diagnosis not present

## 2017-03-28 DIAGNOSIS — Z01419 Encounter for gynecological examination (general) (routine) without abnormal findings: Secondary | ICD-10-CM | POA: Diagnosis not present

## 2017-03-28 DIAGNOSIS — Z23 Encounter for immunization: Secondary | ICD-10-CM | POA: Diagnosis not present

## 2017-03-28 DIAGNOSIS — I10 Essential (primary) hypertension: Secondary | ICD-10-CM | POA: Diagnosis not present

## 2017-03-28 DIAGNOSIS — Z1389 Encounter for screening for other disorder: Secondary | ICD-10-CM | POA: Diagnosis not present

## 2017-03-28 DIAGNOSIS — K219 Gastro-esophageal reflux disease without esophagitis: Secondary | ICD-10-CM | POA: Diagnosis not present

## 2017-03-28 DIAGNOSIS — L719 Rosacea, unspecified: Secondary | ICD-10-CM | POA: Diagnosis not present

## 2017-03-28 DIAGNOSIS — J309 Allergic rhinitis, unspecified: Secondary | ICD-10-CM | POA: Diagnosis not present

## 2017-03-28 DIAGNOSIS — E039 Hypothyroidism, unspecified: Secondary | ICD-10-CM | POA: Diagnosis not present

## 2017-05-22 DIAGNOSIS — M8588 Other specified disorders of bone density and structure, other site: Secondary | ICD-10-CM | POA: Diagnosis not present

## 2017-05-22 DIAGNOSIS — M810 Age-related osteoporosis without current pathological fracture: Secondary | ICD-10-CM | POA: Diagnosis not present

## 2017-08-01 DIAGNOSIS — H5310 Unspecified subjective visual disturbances: Secondary | ICD-10-CM | POA: Diagnosis not present

## 2017-08-01 DIAGNOSIS — H40023 Open angle with borderline findings, high risk, bilateral: Secondary | ICD-10-CM | POA: Diagnosis not present

## 2017-08-02 DIAGNOSIS — D485 Neoplasm of uncertain behavior of skin: Secondary | ICD-10-CM | POA: Diagnosis not present

## 2017-08-02 DIAGNOSIS — L820 Inflamed seborrheic keratosis: Secondary | ICD-10-CM | POA: Diagnosis not present

## 2017-08-02 DIAGNOSIS — L821 Other seborrheic keratosis: Secondary | ICD-10-CM | POA: Diagnosis not present

## 2017-08-09 DIAGNOSIS — B029 Zoster without complications: Secondary | ICD-10-CM | POA: Diagnosis not present

## 2017-09-25 DIAGNOSIS — Z23 Encounter for immunization: Secondary | ICD-10-CM | POA: Diagnosis not present

## 2017-09-25 DIAGNOSIS — E039 Hypothyroidism, unspecified: Secondary | ICD-10-CM | POA: Diagnosis not present

## 2017-09-25 DIAGNOSIS — E7800 Pure hypercholesterolemia, unspecified: Secondary | ICD-10-CM | POA: Diagnosis not present

## 2017-09-25 DIAGNOSIS — I10 Essential (primary) hypertension: Secondary | ICD-10-CM | POA: Diagnosis not present

## 2017-10-09 DIAGNOSIS — Z1231 Encounter for screening mammogram for malignant neoplasm of breast: Secondary | ICD-10-CM | POA: Diagnosis not present

## 2017-11-27 DIAGNOSIS — Z23 Encounter for immunization: Secondary | ICD-10-CM | POA: Diagnosis not present

## 2018-01-04 DIAGNOSIS — Z23 Encounter for immunization: Secondary | ICD-10-CM | POA: Diagnosis not present

## 2018-01-04 DIAGNOSIS — D2261 Melanocytic nevi of right upper limb, including shoulder: Secondary | ICD-10-CM | POA: Diagnosis not present

## 2018-01-04 DIAGNOSIS — L821 Other seborrheic keratosis: Secondary | ICD-10-CM | POA: Diagnosis not present

## 2018-01-04 DIAGNOSIS — L719 Rosacea, unspecified: Secondary | ICD-10-CM | POA: Diagnosis not present

## 2018-01-04 DIAGNOSIS — D1722 Benign lipomatous neoplasm of skin and subcutaneous tissue of left arm: Secondary | ICD-10-CM | POA: Diagnosis not present

## 2018-01-04 DIAGNOSIS — L820 Inflamed seborrheic keratosis: Secondary | ICD-10-CM | POA: Diagnosis not present

## 2018-01-04 DIAGNOSIS — L309 Dermatitis, unspecified: Secondary | ICD-10-CM | POA: Diagnosis not present

## 2018-01-04 DIAGNOSIS — D2272 Melanocytic nevi of left lower limb, including hip: Secondary | ICD-10-CM | POA: Diagnosis not present

## 2018-01-04 DIAGNOSIS — L570 Actinic keratosis: Secondary | ICD-10-CM | POA: Diagnosis not present

## 2018-02-06 DIAGNOSIS — H43822 Vitreomacular adhesion, left eye: Secondary | ICD-10-CM | POA: Diagnosis not present

## 2018-02-06 DIAGNOSIS — H40023 Open angle with borderline findings, high risk, bilateral: Secondary | ICD-10-CM | POA: Diagnosis not present

## 2018-02-06 DIAGNOSIS — H04123 Dry eye syndrome of bilateral lacrimal glands: Secondary | ICD-10-CM | POA: Diagnosis not present

## 2018-02-06 DIAGNOSIS — H2513 Age-related nuclear cataract, bilateral: Secondary | ICD-10-CM | POA: Diagnosis not present

## 2018-04-03 DIAGNOSIS — M8580 Other specified disorders of bone density and structure, unspecified site: Secondary | ICD-10-CM | POA: Diagnosis not present

## 2018-04-03 DIAGNOSIS — Z8489 Family history of other specified conditions: Secondary | ICD-10-CM | POA: Diagnosis not present

## 2018-05-01 DIAGNOSIS — E039 Hypothyroidism, unspecified: Secondary | ICD-10-CM | POA: Diagnosis not present

## 2018-05-01 DIAGNOSIS — L719 Rosacea, unspecified: Secondary | ICD-10-CM | POA: Diagnosis not present

## 2018-05-01 DIAGNOSIS — J309 Allergic rhinitis, unspecified: Secondary | ICD-10-CM | POA: Diagnosis not present

## 2018-05-01 DIAGNOSIS — I10 Essential (primary) hypertension: Secondary | ICD-10-CM | POA: Diagnosis not present

## 2018-05-01 DIAGNOSIS — Z1389 Encounter for screening for other disorder: Secondary | ICD-10-CM | POA: Diagnosis not present

## 2018-05-01 DIAGNOSIS — K219 Gastro-esophageal reflux disease without esophagitis: Secondary | ICD-10-CM | POA: Diagnosis not present

## 2018-05-01 DIAGNOSIS — E7800 Pure hypercholesterolemia, unspecified: Secondary | ICD-10-CM | POA: Diagnosis not present

## 2018-05-01 DIAGNOSIS — M810 Age-related osteoporosis without current pathological fracture: Secondary | ICD-10-CM | POA: Diagnosis not present

## 2018-05-01 DIAGNOSIS — K635 Polyp of colon: Secondary | ICD-10-CM | POA: Diagnosis not present

## 2018-05-01 DIAGNOSIS — Z0000 Encounter for general adult medical examination without abnormal findings: Secondary | ICD-10-CM | POA: Diagnosis not present

## 2018-05-03 DIAGNOSIS — M25552 Pain in left hip: Secondary | ICD-10-CM | POA: Diagnosis not present

## 2018-05-03 DIAGNOSIS — W19XXXA Unspecified fall, initial encounter: Secondary | ICD-10-CM | POA: Diagnosis not present

## 2018-05-04 DIAGNOSIS — M25552 Pain in left hip: Secondary | ICD-10-CM | POA: Diagnosis not present

## 2018-05-14 DIAGNOSIS — M25552 Pain in left hip: Secondary | ICD-10-CM | POA: Diagnosis not present

## 2018-05-22 DIAGNOSIS — M25552 Pain in left hip: Secondary | ICD-10-CM | POA: Diagnosis not present

## 2018-05-29 DIAGNOSIS — M25552 Pain in left hip: Secondary | ICD-10-CM | POA: Diagnosis not present

## 2018-10-01 DIAGNOSIS — L821 Other seborrheic keratosis: Secondary | ICD-10-CM | POA: Diagnosis not present

## 2018-10-01 DIAGNOSIS — R591 Generalized enlarged lymph nodes: Secondary | ICD-10-CM | POA: Diagnosis not present

## 2018-10-15 DIAGNOSIS — Z1231 Encounter for screening mammogram for malignant neoplasm of breast: Secondary | ICD-10-CM | POA: Diagnosis not present

## 2018-11-23 DIAGNOSIS — D485 Neoplasm of uncertain behavior of skin: Secondary | ICD-10-CM | POA: Diagnosis not present

## 2018-11-23 DIAGNOSIS — L720 Epidermal cyst: Secondary | ICD-10-CM | POA: Diagnosis not present

## 2018-12-31 DIAGNOSIS — H5319 Other subjective visual disturbances: Secondary | ICD-10-CM | POA: Diagnosis not present

## 2019-01-03 DIAGNOSIS — L719 Rosacea, unspecified: Secondary | ICD-10-CM | POA: Diagnosis not present

## 2019-01-03 DIAGNOSIS — M792 Neuralgia and neuritis, unspecified: Secondary | ICD-10-CM | POA: Diagnosis not present

## 2019-01-03 DIAGNOSIS — L821 Other seborrheic keratosis: Secondary | ICD-10-CM | POA: Diagnosis not present

## 2019-01-03 DIAGNOSIS — Z23 Encounter for immunization: Secondary | ICD-10-CM | POA: Diagnosis not present

## 2019-01-03 DIAGNOSIS — L710 Perioral dermatitis: Secondary | ICD-10-CM | POA: Diagnosis not present

## 2019-02-08 DIAGNOSIS — H25013 Cortical age-related cataract, bilateral: Secondary | ICD-10-CM | POA: Diagnosis not present

## 2019-02-08 DIAGNOSIS — H2513 Age-related nuclear cataract, bilateral: Secondary | ICD-10-CM | POA: Diagnosis not present

## 2019-02-08 DIAGNOSIS — H40023 Open angle with borderline findings, high risk, bilateral: Secondary | ICD-10-CM | POA: Diagnosis not present

## 2019-02-08 DIAGNOSIS — H5319 Other subjective visual disturbances: Secondary | ICD-10-CM | POA: Diagnosis not present

## 2019-02-11 DIAGNOSIS — M79609 Pain in unspecified limb: Secondary | ICD-10-CM | POA: Diagnosis not present

## 2019-04-04 ENCOUNTER — Other Ambulatory Visit: Payer: Self-pay | Admitting: Internal Medicine

## 2019-04-04 DIAGNOSIS — Z7189 Other specified counseling: Secondary | ICD-10-CM | POA: Diagnosis not present

## 2019-04-04 DIAGNOSIS — M8589 Other specified disorders of bone density and structure, multiple sites: Secondary | ICD-10-CM

## 2019-04-04 DIAGNOSIS — Z8489 Family history of other specified conditions: Secondary | ICD-10-CM | POA: Diagnosis not present

## 2019-04-04 DIAGNOSIS — M8580 Other specified disorders of bone density and structure, unspecified site: Secondary | ICD-10-CM | POA: Diagnosis not present

## 2019-05-06 DIAGNOSIS — Z1389 Encounter for screening for other disorder: Secondary | ICD-10-CM | POA: Diagnosis not present

## 2019-05-06 DIAGNOSIS — E7800 Pure hypercholesterolemia, unspecified: Secondary | ICD-10-CM | POA: Diagnosis not present

## 2019-05-06 DIAGNOSIS — L719 Rosacea, unspecified: Secondary | ICD-10-CM | POA: Diagnosis not present

## 2019-05-06 DIAGNOSIS — K219 Gastro-esophageal reflux disease without esophagitis: Secondary | ICD-10-CM | POA: Diagnosis not present

## 2019-05-06 DIAGNOSIS — M810 Age-related osteoporosis without current pathological fracture: Secondary | ICD-10-CM | POA: Diagnosis not present

## 2019-05-06 DIAGNOSIS — J309 Allergic rhinitis, unspecified: Secondary | ICD-10-CM | POA: Diagnosis not present

## 2019-05-06 DIAGNOSIS — I10 Essential (primary) hypertension: Secondary | ICD-10-CM | POA: Diagnosis not present

## 2019-05-06 DIAGNOSIS — Z0000 Encounter for general adult medical examination without abnormal findings: Secondary | ICD-10-CM | POA: Diagnosis not present

## 2019-05-06 DIAGNOSIS — K635 Polyp of colon: Secondary | ICD-10-CM | POA: Diagnosis not present

## 2019-05-06 DIAGNOSIS — E039 Hypothyroidism, unspecified: Secondary | ICD-10-CM | POA: Diagnosis not present

## 2019-05-21 DIAGNOSIS — H6120 Impacted cerumen, unspecified ear: Secondary | ICD-10-CM | POA: Diagnosis not present

## 2019-05-21 DIAGNOSIS — H6090 Unspecified otitis externa, unspecified ear: Secondary | ICD-10-CM | POA: Diagnosis not present

## 2019-05-28 DIAGNOSIS — H9209 Otalgia, unspecified ear: Secondary | ICD-10-CM | POA: Diagnosis not present

## 2019-06-12 DIAGNOSIS — M545 Low back pain: Secondary | ICD-10-CM | POA: Diagnosis not present

## 2019-06-12 DIAGNOSIS — M25552 Pain in left hip: Secondary | ICD-10-CM | POA: Diagnosis not present

## 2019-06-19 DIAGNOSIS — K219 Gastro-esophageal reflux disease without esophagitis: Secondary | ICD-10-CM | POA: Diagnosis not present

## 2019-06-19 DIAGNOSIS — M810 Age-related osteoporosis without current pathological fracture: Secondary | ICD-10-CM | POA: Diagnosis not present

## 2019-06-19 DIAGNOSIS — I10 Essential (primary) hypertension: Secondary | ICD-10-CM | POA: Diagnosis not present

## 2019-06-19 DIAGNOSIS — H6690 Otitis media, unspecified, unspecified ear: Secondary | ICD-10-CM | POA: Diagnosis not present

## 2019-06-19 DIAGNOSIS — E039 Hypothyroidism, unspecified: Secondary | ICD-10-CM | POA: Diagnosis not present

## 2019-06-20 ENCOUNTER — Other Ambulatory Visit: Payer: Self-pay

## 2019-06-20 ENCOUNTER — Ambulatory Visit
Admission: RE | Admit: 2019-06-20 | Discharge: 2019-06-20 | Disposition: A | Discharge status: Home / Self Care | Payer: Medicare Other | Source: Ambulatory Visit | Attending: Internal Medicine | Admitting: Internal Medicine

## 2019-06-20 DIAGNOSIS — Z780 Asymptomatic menopausal state: Secondary | ICD-10-CM | POA: Diagnosis not present

## 2019-06-20 DIAGNOSIS — M8589 Other specified disorders of bone density and structure, multiple sites: Secondary | ICD-10-CM | POA: Diagnosis not present

## 2019-07-22 DIAGNOSIS — M79605 Pain in left leg: Secondary | ICD-10-CM | POA: Diagnosis not present

## 2019-07-22 DIAGNOSIS — M7062 Trochanteric bursitis, left hip: Secondary | ICD-10-CM | POA: Diagnosis not present

## 2019-07-24 DIAGNOSIS — M7062 Trochanteric bursitis, left hip: Secondary | ICD-10-CM | POA: Diagnosis not present

## 2019-07-25 DIAGNOSIS — B369 Superficial mycosis, unspecified: Secondary | ICD-10-CM | POA: Diagnosis not present

## 2019-07-25 DIAGNOSIS — H903 Sensorineural hearing loss, bilateral: Secondary | ICD-10-CM | POA: Diagnosis not present

## 2019-07-25 DIAGNOSIS — H6241 Otitis externa in other diseases classified elsewhere, right ear: Secondary | ICD-10-CM | POA: Diagnosis not present

## 2019-07-31 DIAGNOSIS — M7062 Trochanteric bursitis, left hip: Secondary | ICD-10-CM | POA: Diagnosis not present

## 2019-08-07 DIAGNOSIS — M7062 Trochanteric bursitis, left hip: Secondary | ICD-10-CM | POA: Diagnosis not present

## 2019-08-08 DIAGNOSIS — B369 Superficial mycosis, unspecified: Secondary | ICD-10-CM | POA: Diagnosis not present

## 2019-08-08 DIAGNOSIS — H6240 Otitis externa in other diseases classified elsewhere, unspecified ear: Secondary | ICD-10-CM | POA: Diagnosis not present

## 2019-08-08 DIAGNOSIS — H905 Unspecified sensorineural hearing loss: Secondary | ICD-10-CM | POA: Diagnosis not present

## 2019-08-13 ENCOUNTER — Other Ambulatory Visit: Payer: Self-pay | Admitting: Family Medicine

## 2019-08-13 ENCOUNTER — Ambulatory Visit
Admission: RE | Admit: 2019-08-13 | Discharge: 2019-08-13 | Disposition: A | Discharge status: Home / Self Care | Payer: Medicare Other | Source: Ambulatory Visit | Attending: Family Medicine | Admitting: Family Medicine

## 2019-08-13 DIAGNOSIS — M25522 Pain in left elbow: Secondary | ICD-10-CM | POA: Diagnosis not present

## 2019-08-13 DIAGNOSIS — S5002XA Contusion of left elbow, initial encounter: Secondary | ICD-10-CM | POA: Diagnosis not present

## 2019-08-13 DIAGNOSIS — W19XXXA Unspecified fall, initial encounter: Secondary | ICD-10-CM | POA: Diagnosis not present

## 2019-08-13 DIAGNOSIS — S8002XA Contusion of left knee, initial encounter: Secondary | ICD-10-CM | POA: Diagnosis not present

## 2019-08-14 DIAGNOSIS — M7062 Trochanteric bursitis, left hip: Secondary | ICD-10-CM | POA: Diagnosis not present

## 2019-08-20 DIAGNOSIS — M25552 Pain in left hip: Secondary | ICD-10-CM | POA: Diagnosis not present

## 2019-10-07 DIAGNOSIS — Z09 Encounter for follow-up examination after completed treatment for conditions other than malignant neoplasm: Secondary | ICD-10-CM | POA: Diagnosis not present

## 2019-10-07 DIAGNOSIS — Z8669 Personal history of other diseases of the nervous system and sense organs: Secondary | ICD-10-CM | POA: Diagnosis not present

## 2019-10-07 DIAGNOSIS — H903 Sensorineural hearing loss, bilateral: Secondary | ICD-10-CM | POA: Diagnosis not present

## 2019-10-16 ENCOUNTER — Other Ambulatory Visit: Payer: Self-pay | Admitting: Family Medicine

## 2019-10-16 DIAGNOSIS — M545 Low back pain, unspecified: Secondary | ICD-10-CM

## 2019-10-16 DIAGNOSIS — M7070 Other bursitis of hip, unspecified hip: Secondary | ICD-10-CM | POA: Diagnosis not present

## 2019-10-21 DIAGNOSIS — Z1231 Encounter for screening mammogram for malignant neoplasm of breast: Secondary | ICD-10-CM | POA: Diagnosis not present

## 2019-11-08 ENCOUNTER — Ambulatory Visit
Admission: RE | Admit: 2019-11-08 | Discharge: 2019-11-08 | Disposition: A | Discharge status: Home / Self Care | Payer: Medicare Other | Source: Ambulatory Visit | Attending: Family Medicine | Admitting: Family Medicine

## 2019-11-08 ENCOUNTER — Other Ambulatory Visit: Payer: Self-pay

## 2019-11-08 DIAGNOSIS — M545 Low back pain, unspecified: Secondary | ICD-10-CM

## 2019-11-08 DIAGNOSIS — M48061 Spinal stenosis, lumbar region without neurogenic claudication: Secondary | ICD-10-CM | POA: Diagnosis not present

## 2019-11-10 ENCOUNTER — Other Ambulatory Visit: Payer: Medicare Other

## 2019-11-22 DIAGNOSIS — Z9109 Other allergy status, other than to drugs and biological substances: Secondary | ICD-10-CM | POA: Diagnosis not present

## 2019-11-22 DIAGNOSIS — Z20822 Contact with and (suspected) exposure to covid-19: Secondary | ICD-10-CM | POA: Diagnosis not present

## 2019-11-28 DIAGNOSIS — M4316 Spondylolisthesis, lumbar region: Secondary | ICD-10-CM | POA: Diagnosis not present

## 2019-12-11 DIAGNOSIS — M4316 Spondylolisthesis, lumbar region: Secondary | ICD-10-CM | POA: Diagnosis not present

## 2019-12-20 DIAGNOSIS — M4316 Spondylolisthesis, lumbar region: Secondary | ICD-10-CM | POA: Diagnosis not present

## 2019-12-24 DIAGNOSIS — M4316 Spondylolisthesis, lumbar region: Secondary | ICD-10-CM | POA: Diagnosis not present

## 2019-12-27 DIAGNOSIS — M4316 Spondylolisthesis, lumbar region: Secondary | ICD-10-CM | POA: Diagnosis not present

## 2019-12-31 DIAGNOSIS — M4316 Spondylolisthesis, lumbar region: Secondary | ICD-10-CM | POA: Diagnosis not present

## 2020-01-02 DIAGNOSIS — M4316 Spondylolisthesis, lumbar region: Secondary | ICD-10-CM | POA: Diagnosis not present

## 2020-01-07 DIAGNOSIS — M4316 Spondylolisthesis, lumbar region: Secondary | ICD-10-CM | POA: Diagnosis not present

## 2020-01-09 DIAGNOSIS — M4316 Spondylolisthesis, lumbar region: Secondary | ICD-10-CM | POA: Diagnosis not present

## 2020-01-16 DIAGNOSIS — M4316 Spondylolisthesis, lumbar region: Secondary | ICD-10-CM | POA: Diagnosis not present

## 2020-01-16 DIAGNOSIS — I10 Essential (primary) hypertension: Secondary | ICD-10-CM | POA: Diagnosis not present

## 2020-01-20 DIAGNOSIS — M4316 Spondylolisthesis, lumbar region: Secondary | ICD-10-CM | POA: Diagnosis not present

## 2020-01-22 DIAGNOSIS — L821 Other seborrheic keratosis: Secondary | ICD-10-CM | POA: Diagnosis not present

## 2020-01-22 DIAGNOSIS — L578 Other skin changes due to chronic exposure to nonionizing radiation: Secondary | ICD-10-CM | POA: Diagnosis not present

## 2020-01-22 DIAGNOSIS — L719 Rosacea, unspecified: Secondary | ICD-10-CM | POA: Diagnosis not present

## 2020-01-22 DIAGNOSIS — D2261 Melanocytic nevi of right upper limb, including shoulder: Secondary | ICD-10-CM | POA: Diagnosis not present

## 2020-01-22 DIAGNOSIS — D2262 Melanocytic nevi of left upper limb, including shoulder: Secondary | ICD-10-CM | POA: Diagnosis not present

## 2020-01-22 DIAGNOSIS — L570 Actinic keratosis: Secondary | ICD-10-CM | POA: Diagnosis not present

## 2020-01-28 DIAGNOSIS — M4316 Spondylolisthesis, lumbar region: Secondary | ICD-10-CM | POA: Diagnosis not present

## 2020-02-04 DIAGNOSIS — Z01419 Encounter for gynecological examination (general) (routine) without abnormal findings: Secondary | ICD-10-CM | POA: Diagnosis not present

## 2020-02-04 DIAGNOSIS — M4316 Spondylolisthesis, lumbar region: Secondary | ICD-10-CM | POA: Diagnosis not present

## 2020-02-07 DIAGNOSIS — S61215A Laceration without foreign body of left ring finger without damage to nail, initial encounter: Secondary | ICD-10-CM | POA: Diagnosis not present

## 2020-02-07 DIAGNOSIS — Z792 Long term (current) use of antibiotics: Secondary | ICD-10-CM | POA: Diagnosis not present

## 2020-02-10 DIAGNOSIS — H43822 Vitreomacular adhesion, left eye: Secondary | ICD-10-CM | POA: Diagnosis not present

## 2020-02-10 DIAGNOSIS — H40023 Open angle with borderline findings, high risk, bilateral: Secondary | ICD-10-CM | POA: Diagnosis not present

## 2020-02-10 DIAGNOSIS — H2513 Age-related nuclear cataract, bilateral: Secondary | ICD-10-CM | POA: Diagnosis not present

## 2020-02-10 DIAGNOSIS — H25013 Cortical age-related cataract, bilateral: Secondary | ICD-10-CM | POA: Diagnosis not present

## 2020-02-12 DIAGNOSIS — Z20822 Contact with and (suspected) exposure to covid-19: Secondary | ICD-10-CM | POA: Diagnosis not present

## 2020-02-15 DIAGNOSIS — R059 Cough, unspecified: Secondary | ICD-10-CM | POA: Diagnosis not present

## 2020-02-15 DIAGNOSIS — U071 COVID-19: Secondary | ICD-10-CM | POA: Diagnosis not present

## 2020-02-17 ENCOUNTER — Other Ambulatory Visit (HOSPITAL_COMMUNITY): Payer: Self-pay

## 2020-02-17 DIAGNOSIS — U071 COVID-19: Secondary | ICD-10-CM | POA: Diagnosis not present

## 2020-02-18 ENCOUNTER — Other Ambulatory Visit: Payer: Self-pay | Admitting: Unknown Physician Specialty

## 2020-02-18 ENCOUNTER — Ambulatory Visit (HOSPITAL_COMMUNITY)
Admission: RE | Admit: 2020-02-18 | Discharge: 2020-02-18 | Disposition: A | Discharge status: Home / Self Care | Payer: Medicare Other | Source: Ambulatory Visit | Attending: Pulmonary Disease | Admitting: Pulmonary Disease

## 2020-02-18 ENCOUNTER — Telehealth: Payer: Self-pay | Admitting: Unknown Physician Specialty

## 2020-02-18 ENCOUNTER — Telehealth (HOSPITAL_COMMUNITY): Payer: Self-pay | Admitting: Emergency Medicine

## 2020-02-18 DIAGNOSIS — U071 COVID-19: Secondary | ICD-10-CM | POA: Insufficient documentation

## 2020-02-18 DIAGNOSIS — Z23 Encounter for immunization: Secondary | ICD-10-CM | POA: Insufficient documentation

## 2020-02-18 DIAGNOSIS — R54 Age-related physical debility: Secondary | ICD-10-CM | POA: Diagnosis not present

## 2020-02-18 MED ORDER — SOTROVIMAB 500 MG/8ML IV SOLN
500.0000 mg | Freq: Once | INTRAVENOUS | Status: AC
  Administered 2020-02-18: 500 mg via INTRAVENOUS

## 2020-02-18 MED ORDER — FAMOTIDINE IN NACL 20-0.9 MG/50ML-% IV SOLN: 20.0000 mg | Freq: Once | INTRAVENOUS | Status: DC | PRN

## 2020-02-18 MED ORDER — ALBUTEROL SULFATE HFA 108 (90 BASE) MCG/ACT IN AERS: 2.0000 | INHALATION_SPRAY | Freq: Once | RESPIRATORY_TRACT | Status: DC | PRN

## 2020-02-18 MED ORDER — EPINEPHRINE 0.3 MG/0.3ML IJ SOAJ: 0.3000 mg | Freq: Once | INTRAMUSCULAR | Status: DC | PRN

## 2020-02-18 MED ORDER — DIPHENHYDRAMINE HCL 50 MG/ML IJ SOLN: 50.0000 mg | Freq: Once | INTRAMUSCULAR | Status: DC | PRN

## 2020-02-18 MED ORDER — METHYLPREDNISOLONE SODIUM SUCC 125 MG IJ SOLR: 125.0000 mg | Freq: Once | INTRAMUSCULAR | Status: DC | PRN

## 2020-02-18 MED ORDER — SODIUM CHLORIDE 0.9 % IV SOLN: INTRAVENOUS | Status: DC | PRN

## 2020-02-18 NOTE — Telephone Encounter (Signed)
Called pt and explained possible monoclonal antibody treatment. Sx started 11/25. Tested positive 11/27 at [facility] on [address]. Said she email a copy to the [email or web address]. Sx include cough, chest congestion, head congestion, nasal drainage, temperature, and fatigue. Qualifying risk factors include HTN and BMI is 26.5. Pt fully vaccinated with Moderna 05/01/19. Pt interested in tx. Informed pt an APP will call back to possibly schedule an appointment. Gave pt insurance CPT code [phone] to call insurance company regarding coverage.

## 2020-02-18 NOTE — Progress Notes (Signed)
Diagnosis: COVID-19  Physician: Dr. Patrick Wright  Procedure: [facility] Infusion [facility] Med: Sotrovimab infusion - Provided patient with sotrovimab fact sheet for patients, parents, and caregivers prior to infusion.   Complications: No immediate complications noted  Discharge: Discharged home    

## 2020-02-18 NOTE — Progress Notes (Signed)
Patient reviewed Fact Sheet for Patients, Parents, and Caregivers for Emergency Use Authorization (EUA) of Sotrovimab for the Treatment of Coronavirus. Patient also reviewed and is agreeable to the estimated cost of treatment. Patient is agreeable to proceed.   

## 2020-02-18 NOTE — Telephone Encounter (Signed)
I connected by phone with Laurie Vazquez on 02/18/2020 at 4:19 PM to discuss the potential use of a new treatment for mild to moderate COVID-19 viral infection in non-hospitalized patients.  This patient is a 79 y.o. female that meets the FDA criteria for Emergency Use Authorization of COVID monoclonal antibody casirivimab/imdevimab, bamlanivimab/eteseviamb, or sotrovimab.  Has a (+) direct SARS-CoV-2 viral test result  Has mild or moderate COVID-19   Is NOT hospitalized due to COVID-19  Is within 10 days of symptom onset  Has at least one of the high risk factor(s) for progression to severe COVID-19 and/or hospitalization as defined in EUA.  Specific high risk criteria : Older age (>/= 78 yo)   I have spoken and communicated the following to the patient or parent/caregiver regarding COVID monoclonal antibody treatment:  1. FDA has authorized the emergency use for the treatment of mild to moderate COVID-19 in adults and pediatric patients with positive results of direct SARS-CoV-2 viral testing who are 58 years of age and older weighing at least 40 kg, and who are at high risk for progressing to severe COVID-19 and/or hospitalization.  2. The significant known and potential risks and benefits of COVID monoclonal antibody, and the extent to which such potential risks and benefits are unknown.  3. Information on available alternative treatments and the risks and benefits of those alternatives, including clinical trials.  4. Patients treated with COVID monoclonal antibody should continue to self-isolate and use infection control measures (e.g., wear mask, isolate, social distance, avoid sharing personal items, clean and disinfect "high touch" surfaces, and frequent handwashing) according to CDC guidelines.   5. The patient or parent/caregiver has the option to accept or refuse COVID monoclonal antibody treatment.  After reviewing this information with the patient, the patient has agreed to  receive one of the available covid 19 monoclonal antibodies and will be provided an appropriate fact sheet prior to infusion. [name], NP 02/18/2020 4:19 PM

## 2020-02-18 NOTE — Discharge Instructions (Signed)
10 Things You Can Do to Manage Your COVID-19 Symptoms at Home If you have possible or confirmed COVID-19: 1. Stay home from work and school. And stay away from other public places. If you must go out, avoid using any kind of public transportation, ridesharing, or taxis. 2. Monitor your symptoms carefully. If your symptoms get worse, call your healthcare provider immediately. 3. Get rest and stay hydrated. 4. If you have a medical appointment, call the healthcare provider ahead of time and tell them that you have or may have COVID-19. 5. For medical emergencies, call 911 and notify the dispatch personnel that you have or may have COVID-19. 6. Cover your cough and sneezes with a tissue or use the inside of your elbow. 7. Wash your hands often with soap and water for at least 20 seconds or clean your hands with an alcohol-based hand sanitizer that contains at least 60% alcohol. 8. As much as possible, stay in a specific room and away from other people in your home. Also, you should use a separate bathroom, if available. If you need to be around other people in or outside of the home, wear a mask. 9. Avoid sharing personal items with other people in your household, like dishes, towels, and bedding. 10. Clean all surfaces that are touched often, like counters, tabletops, and doorknobs. Use household cleaning sprays or wipes according to the label instructions. cdc.gov/coronavirus 09/19/2018 This information is not intended to replace advice given to you by your health care provider. Make sure you discuss any questions you have with your health care provider. Document Revised: 02/21/2019 Document Reviewed: 02/21/2019 Elsevier Patient Education  2020 Elsevier Inc.  What types of side effects do monoclonal antibody drugs cause?  Common side effects  In general, the more common side effects caused by monoclonal antibody drugs include: . Allergic reactions, such as hives or itching . Flu-like signs and  symptoms, including chills, fatigue, fever, and muscle aches and pains . Nausea, vomiting . Diarrhea . Skin rashes . Low blood pressure   The CDC is recommending patients who receive monoclonal antibody treatments wait at least 90 days before being vaccinated.  Currently, there are no data on the safety and efficacy of mRNA COVID-19 vaccines in persons who received monoclonal antibodies or convalescent plasma as part of COVID-19 treatment. Based on the estimated half-life of such therapies as well as evidence suggesting that reinfection is uncommon in the 90 days after initial infection, vaccination should be deferred for at least 90 days, as a precautionary measure until additional information becomes available, to avoid interference of the antibody treatment with vaccine-induced immune responses.  If you have any questions or concerns after the infusion please call the Advanced Practice Provider on call at [phone]. This number is only intended for your use regarding questions or concerns about the infusion post-treatment side-effects.  Please do not provide this number to others for use.   If someone you know is interested in receiving treatment please have them call the COVID hotline at [phone].   

## 2020-02-24 DIAGNOSIS — R0602 Shortness of breath: Secondary | ICD-10-CM | POA: Diagnosis not present

## 2020-02-24 DIAGNOSIS — R059 Cough, unspecified: Secondary | ICD-10-CM | POA: Diagnosis not present

## 2020-02-24 DIAGNOSIS — U071 COVID-19: Secondary | ICD-10-CM | POA: Diagnosis not present

## 2020-02-24 DIAGNOSIS — Z4802 Encounter for removal of sutures: Secondary | ICD-10-CM | POA: Diagnosis not present

## 2020-03-02 DIAGNOSIS — S99912A Unspecified injury of left ankle, initial encounter: Secondary | ICD-10-CM | POA: Diagnosis not present

## 2020-03-02 DIAGNOSIS — M79672 Pain in left foot: Secondary | ICD-10-CM | POA: Diagnosis not present

## 2020-03-02 DIAGNOSIS — M25572 Pain in left ankle and joints of left foot: Secondary | ICD-10-CM | POA: Diagnosis not present

## 2020-04-02 DIAGNOSIS — E559 Vitamin D deficiency, unspecified: Secondary | ICD-10-CM | POA: Diagnosis not present

## 2020-04-02 DIAGNOSIS — M8580 Other specified disorders of bone density and structure, unspecified site: Secondary | ICD-10-CM | POA: Diagnosis not present

## 2020-04-02 DIAGNOSIS — Z8489 Family history of other specified conditions: Secondary | ICD-10-CM | POA: Diagnosis not present

## 2020-04-09 DIAGNOSIS — H2511 Age-related nuclear cataract, right eye: Secondary | ICD-10-CM | POA: Diagnosis not present

## 2020-04-09 DIAGNOSIS — H25013 Cortical age-related cataract, bilateral: Secondary | ICD-10-CM | POA: Diagnosis not present

## 2020-04-09 DIAGNOSIS — H40023 Open angle with borderline findings, high risk, bilateral: Secondary | ICD-10-CM | POA: Diagnosis not present

## 2020-04-09 DIAGNOSIS — H2513 Age-related nuclear cataract, bilateral: Secondary | ICD-10-CM | POA: Diagnosis not present

## 2020-04-14 DIAGNOSIS — M4316 Spondylolisthesis, lumbar region: Secondary | ICD-10-CM | POA: Diagnosis not present

## 2020-05-05 DIAGNOSIS — M4316 Spondylolisthesis, lumbar region: Secondary | ICD-10-CM | POA: Diagnosis not present

## 2020-05-06 DIAGNOSIS — H2511 Age-related nuclear cataract, right eye: Secondary | ICD-10-CM | POA: Diagnosis not present

## 2020-05-06 DIAGNOSIS — H25811 Combined forms of age-related cataract, right eye: Secondary | ICD-10-CM | POA: Diagnosis not present

## 2020-05-11 DIAGNOSIS — Z0000 Encounter for general adult medical examination without abnormal findings: Secondary | ICD-10-CM | POA: Diagnosis not present

## 2020-05-11 DIAGNOSIS — K219 Gastro-esophageal reflux disease without esophagitis: Secondary | ICD-10-CM | POA: Diagnosis not present

## 2020-05-11 DIAGNOSIS — E039 Hypothyroidism, unspecified: Secondary | ICD-10-CM | POA: Diagnosis not present

## 2020-05-11 DIAGNOSIS — L719 Rosacea, unspecified: Secondary | ICD-10-CM | POA: Diagnosis not present

## 2020-05-11 DIAGNOSIS — I10 Essential (primary) hypertension: Secondary | ICD-10-CM | POA: Diagnosis not present

## 2020-05-11 DIAGNOSIS — M810 Age-related osteoporosis without current pathological fracture: Secondary | ICD-10-CM | POA: Diagnosis not present

## 2020-05-11 DIAGNOSIS — E7800 Pure hypercholesterolemia, unspecified: Secondary | ICD-10-CM | POA: Diagnosis not present

## 2020-05-11 DIAGNOSIS — Z8619 Personal history of other infectious and parasitic diseases: Secondary | ICD-10-CM | POA: Diagnosis not present

## 2020-05-11 DIAGNOSIS — J309 Allergic rhinitis, unspecified: Secondary | ICD-10-CM | POA: Diagnosis not present

## 2020-05-11 DIAGNOSIS — Z1389 Encounter for screening for other disorder: Secondary | ICD-10-CM | POA: Diagnosis not present

## 2020-05-26 DIAGNOSIS — M4316 Spondylolisthesis, lumbar region: Secondary | ICD-10-CM | POA: Diagnosis not present

## 2020-06-16 DIAGNOSIS — M4316 Spondylolisthesis, lumbar region: Secondary | ICD-10-CM | POA: Diagnosis not present

## 2020-07-09 DIAGNOSIS — M4316 Spondylolisthesis, lumbar region: Secondary | ICD-10-CM | POA: Diagnosis not present

## 2020-08-04 DIAGNOSIS — M4316 Spondylolisthesis, lumbar region: Secondary | ICD-10-CM | POA: Diagnosis not present

## 2020-08-25 DIAGNOSIS — M4316 Spondylolisthesis, lumbar region: Secondary | ICD-10-CM | POA: Diagnosis not present

## 2020-10-21 DIAGNOSIS — Z1231 Encounter for screening mammogram for malignant neoplasm of breast: Secondary | ICD-10-CM | POA: Diagnosis not present

## 2020-10-23 DIAGNOSIS — H2512 Age-related nuclear cataract, left eye: Secondary | ICD-10-CM | POA: Diagnosis not present

## 2020-10-23 DIAGNOSIS — H40023 Open angle with borderline findings, high risk, bilateral: Secondary | ICD-10-CM | POA: Diagnosis not present

## 2020-10-23 DIAGNOSIS — H25012 Cortical age-related cataract, left eye: Secondary | ICD-10-CM | POA: Diagnosis not present

## 2020-10-23 DIAGNOSIS — Z961 Presence of intraocular lens: Secondary | ICD-10-CM | POA: Diagnosis not present

## 2020-11-13 DIAGNOSIS — Z23 Encounter for immunization: Secondary | ICD-10-CM | POA: Diagnosis not present

## 2020-11-18 DIAGNOSIS — I10 Essential (primary) hypertension: Secondary | ICD-10-CM | POA: Diagnosis not present

## 2020-11-18 DIAGNOSIS — M67442 Ganglion, left hand: Secondary | ICD-10-CM | POA: Diagnosis not present

## 2020-11-18 DIAGNOSIS — E039 Hypothyroidism, unspecified: Secondary | ICD-10-CM | POA: Diagnosis not present

## 2020-11-18 DIAGNOSIS — E7800 Pure hypercholesterolemia, unspecified: Secondary | ICD-10-CM | POA: Diagnosis not present

## 2021-01-01 DIAGNOSIS — U071 COVID-19: Secondary | ICD-10-CM | POA: Diagnosis not present

## 2021-01-27 DIAGNOSIS — L719 Rosacea, unspecified: Secondary | ICD-10-CM | POA: Diagnosis not present

## 2021-01-27 DIAGNOSIS — L578 Other skin changes due to chronic exposure to nonionizing radiation: Secondary | ICD-10-CM | POA: Diagnosis not present

## 2021-01-27 DIAGNOSIS — D2262 Melanocytic nevi of left upper limb, including shoulder: Secondary | ICD-10-CM | POA: Diagnosis not present

## 2021-01-27 DIAGNOSIS — L821 Other seborrheic keratosis: Secondary | ICD-10-CM | POA: Diagnosis not present

## 2021-01-27 DIAGNOSIS — D2261 Melanocytic nevi of right upper limb, including shoulder: Secondary | ICD-10-CM | POA: Diagnosis not present

## 2021-01-27 DIAGNOSIS — L570 Actinic keratosis: Secondary | ICD-10-CM | POA: Diagnosis not present

## 2021-04-06 ENCOUNTER — Other Ambulatory Visit: Payer: Self-pay | Admitting: Internal Medicine

## 2021-04-06 ENCOUNTER — Ambulatory Visit
Admission: RE | Admit: 2021-04-06 | Discharge: 2021-04-06 | Disposition: A | Discharge status: Home / Self Care | Payer: Medicare Other | Source: Ambulatory Visit | Attending: Internal Medicine | Admitting: Internal Medicine

## 2021-04-06 DIAGNOSIS — M1612 Unilateral primary osteoarthritis, left hip: Secondary | ICD-10-CM | POA: Diagnosis not present

## 2021-04-06 DIAGNOSIS — M25752 Osteophyte, left hip: Secondary | ICD-10-CM | POA: Diagnosis not present

## 2021-04-06 DIAGNOSIS — M810 Age-related osteoporosis without current pathological fracture: Secondary | ICD-10-CM | POA: Diagnosis not present

## 2021-04-06 DIAGNOSIS — M79652 Pain in left thigh: Secondary | ICD-10-CM

## 2021-04-06 DIAGNOSIS — M8580 Other specified disorders of bone density and structure, unspecified site: Secondary | ICD-10-CM | POA: Diagnosis not present

## 2021-04-06 DIAGNOSIS — Z8489 Family history of other specified conditions: Secondary | ICD-10-CM | POA: Diagnosis not present

## 2021-04-06 DIAGNOSIS — E039 Hypothyroidism, unspecified: Secondary | ICD-10-CM | POA: Diagnosis not present

## 2021-04-06 DIAGNOSIS — M533 Sacrococcygeal disorders, not elsewhere classified: Secondary | ICD-10-CM | POA: Diagnosis not present

## 2021-05-10 DIAGNOSIS — M549 Dorsalgia, unspecified: Secondary | ICD-10-CM | POA: Diagnosis not present

## 2021-05-17 DIAGNOSIS — I10 Essential (primary) hypertension: Secondary | ICD-10-CM | POA: Diagnosis not present

## 2021-05-17 DIAGNOSIS — J309 Allergic rhinitis, unspecified: Secondary | ICD-10-CM | POA: Diagnosis not present

## 2021-05-17 DIAGNOSIS — Z0000 Encounter for general adult medical examination without abnormal findings: Secondary | ICD-10-CM | POA: Diagnosis not present

## 2021-05-17 DIAGNOSIS — M5430 Sciatica, unspecified side: Secondary | ICD-10-CM | POA: Diagnosis not present

## 2021-05-17 DIAGNOSIS — Z1389 Encounter for screening for other disorder: Secondary | ICD-10-CM | POA: Diagnosis not present

## 2021-05-17 DIAGNOSIS — M810 Age-related osteoporosis without current pathological fracture: Secondary | ICD-10-CM | POA: Diagnosis not present

## 2021-05-17 DIAGNOSIS — E039 Hypothyroidism, unspecified: Secondary | ICD-10-CM | POA: Diagnosis not present

## 2021-05-17 DIAGNOSIS — L719 Rosacea, unspecified: Secondary | ICD-10-CM | POA: Diagnosis not present

## 2021-05-17 DIAGNOSIS — E7800 Pure hypercholesterolemia, unspecified: Secondary | ICD-10-CM | POA: Diagnosis not present

## 2021-05-17 DIAGNOSIS — K219 Gastro-esophageal reflux disease without esophagitis: Secondary | ICD-10-CM | POA: Diagnosis not present

## 2021-05-17 DIAGNOSIS — Z23 Encounter for immunization: Secondary | ICD-10-CM | POA: Diagnosis not present

## 2021-06-04 ENCOUNTER — Ambulatory Visit
Admission: RE | Admit: 2021-06-04 | Discharge: 2021-06-04 | Disposition: A | Discharge status: Home / Self Care | Payer: Medicare Other | Source: Ambulatory Visit | Attending: Family Medicine | Admitting: Family Medicine

## 2021-06-04 ENCOUNTER — Other Ambulatory Visit: Payer: Self-pay | Admitting: Family Medicine

## 2021-06-04 DIAGNOSIS — M549 Dorsalgia, unspecified: Secondary | ICD-10-CM

## 2021-06-04 DIAGNOSIS — R0781 Pleurodynia: Secondary | ICD-10-CM | POA: Diagnosis not present

## 2021-06-04 DIAGNOSIS — M810 Age-related osteoporosis without current pathological fracture: Secondary | ICD-10-CM | POA: Diagnosis not present

## 2021-06-04 DIAGNOSIS — M47814 Spondylosis without myelopathy or radiculopathy, thoracic region: Secondary | ICD-10-CM | POA: Diagnosis not present

## 2021-06-04 DIAGNOSIS — M546 Pain in thoracic spine: Secondary | ICD-10-CM | POA: Diagnosis not present

## 2021-08-02 DIAGNOSIS — H903 Sensorineural hearing loss, bilateral: Secondary | ICD-10-CM | POA: Diagnosis not present

## 2021-08-09 DIAGNOSIS — H26491 Other secondary cataract, right eye: Secondary | ICD-10-CM | POA: Diagnosis not present

## 2021-08-09 DIAGNOSIS — H43811 Vitreous degeneration, right eye: Secondary | ICD-10-CM | POA: Diagnosis not present

## 2021-08-09 DIAGNOSIS — Z961 Presence of intraocular lens: Secondary | ICD-10-CM | POA: Diagnosis not present

## 2021-09-14 DIAGNOSIS — H43811 Vitreous degeneration, right eye: Secondary | ICD-10-CM | POA: Diagnosis not present

## 2021-09-14 DIAGNOSIS — H26491 Other secondary cataract, right eye: Secondary | ICD-10-CM | POA: Diagnosis not present

## 2021-09-14 DIAGNOSIS — Z961 Presence of intraocular lens: Secondary | ICD-10-CM | POA: Diagnosis not present

## 2021-10-26 DIAGNOSIS — Z1231 Encounter for screening mammogram for malignant neoplasm of breast: Secondary | ICD-10-CM | POA: Diagnosis not present

## 2021-11-08 DIAGNOSIS — K130 Diseases of lips: Secondary | ICD-10-CM | POA: Diagnosis not present

## 2021-11-08 DIAGNOSIS — L821 Other seborrheic keratosis: Secondary | ICD-10-CM | POA: Diagnosis not present

## 2021-11-10 DIAGNOSIS — H43811 Vitreous degeneration, right eye: Secondary | ICD-10-CM | POA: Diagnosis not present

## 2021-11-10 DIAGNOSIS — H26491 Other secondary cataract, right eye: Secondary | ICD-10-CM | POA: Diagnosis not present

## 2021-11-10 DIAGNOSIS — Z961 Presence of intraocular lens: Secondary | ICD-10-CM | POA: Diagnosis not present

## 2021-11-15 DIAGNOSIS — M810 Age-related osteoporosis without current pathological fracture: Secondary | ICD-10-CM | POA: Diagnosis not present

## 2021-11-15 DIAGNOSIS — I10 Essential (primary) hypertension: Secondary | ICD-10-CM | POA: Diagnosis not present

## 2021-11-15 DIAGNOSIS — J309 Allergic rhinitis, unspecified: Secondary | ICD-10-CM | POA: Diagnosis not present

## 2021-11-15 DIAGNOSIS — E7800 Pure hypercholesterolemia, unspecified: Secondary | ICD-10-CM | POA: Diagnosis not present

## 2021-11-15 DIAGNOSIS — R002 Palpitations: Secondary | ICD-10-CM | POA: Diagnosis not present

## 2021-11-15 DIAGNOSIS — E039 Hypothyroidism, unspecified: Secondary | ICD-10-CM | POA: Diagnosis not present

## 2021-12-14 DIAGNOSIS — M25552 Pain in left hip: Secondary | ICD-10-CM | POA: Diagnosis not present

## 2021-12-15 DIAGNOSIS — L738 Other specified follicular disorders: Secondary | ICD-10-CM | POA: Diagnosis not present

## 2021-12-15 DIAGNOSIS — L710 Perioral dermatitis: Secondary | ICD-10-CM | POA: Diagnosis not present

## 2021-12-15 DIAGNOSIS — L821 Other seborrheic keratosis: Secondary | ICD-10-CM | POA: Diagnosis not present

## 2021-12-15 DIAGNOSIS — L570 Actinic keratosis: Secondary | ICD-10-CM | POA: Diagnosis not present

## 2021-12-27 DIAGNOSIS — Z23 Encounter for immunization: Secondary | ICD-10-CM | POA: Diagnosis not present

## 2022-01-27 DIAGNOSIS — D0462 Carcinoma in situ of skin of left upper limb, including shoulder: Secondary | ICD-10-CM | POA: Diagnosis not present

## 2022-01-27 DIAGNOSIS — L570 Actinic keratosis: Secondary | ICD-10-CM | POA: Diagnosis not present

## 2022-01-27 DIAGNOSIS — D2261 Melanocytic nevi of right upper limb, including shoulder: Secondary | ICD-10-CM | POA: Diagnosis not present

## 2022-01-27 DIAGNOSIS — D1722 Benign lipomatous neoplasm of skin and subcutaneous tissue of left arm: Secondary | ICD-10-CM | POA: Diagnosis not present

## 2022-01-27 DIAGNOSIS — D485 Neoplasm of uncertain behavior of skin: Secondary | ICD-10-CM | POA: Diagnosis not present

## 2022-01-27 DIAGNOSIS — L821 Other seborrheic keratosis: Secondary | ICD-10-CM | POA: Diagnosis not present

## 2022-01-27 DIAGNOSIS — L719 Rosacea, unspecified: Secondary | ICD-10-CM | POA: Diagnosis not present

## 2022-01-27 DIAGNOSIS — D2272 Melanocytic nevi of left lower limb, including hip: Secondary | ICD-10-CM | POA: Diagnosis not present

## 2022-01-27 DIAGNOSIS — L578 Other skin changes due to chronic exposure to nonionizing radiation: Secondary | ICD-10-CM | POA: Diagnosis not present

## 2022-01-27 DIAGNOSIS — L820 Inflamed seborrheic keratosis: Secondary | ICD-10-CM | POA: Diagnosis not present

## 2022-03-11 DIAGNOSIS — U071 COVID-19: Secondary | ICD-10-CM | POA: Diagnosis not present

## 2022-03-31 DIAGNOSIS — U099 Post covid-19 condition, unspecified: Secondary | ICD-10-CM | POA: Diagnosis not present

## 2022-03-31 DIAGNOSIS — R053 Chronic cough: Secondary | ICD-10-CM | POA: Diagnosis not present

## 2022-03-31 DIAGNOSIS — I10 Essential (primary) hypertension: Secondary | ICD-10-CM | POA: Diagnosis not present

## 2022-04-07 ENCOUNTER — Ambulatory Visit
Admission: RE | Admit: 2022-04-07 | Discharge: 2022-04-07 | Disposition: A | Discharge status: Home / Self Care | Payer: Medicare Other | Source: Ambulatory Visit | Attending: Internal Medicine | Admitting: Internal Medicine

## 2022-04-07 ENCOUNTER — Other Ambulatory Visit: Payer: Self-pay | Admitting: Internal Medicine

## 2022-04-07 DIAGNOSIS — M810 Age-related osteoporosis without current pathological fracture: Secondary | ICD-10-CM | POA: Diagnosis not present

## 2022-04-07 DIAGNOSIS — E039 Hypothyroidism, unspecified: Secondary | ICD-10-CM | POA: Diagnosis not present

## 2022-04-07 DIAGNOSIS — M8588 Other specified disorders of bone density and structure, other site: Secondary | ICD-10-CM | POA: Diagnosis not present

## 2022-04-07 DIAGNOSIS — M8580 Other specified disorders of bone density and structure, unspecified site: Secondary | ICD-10-CM | POA: Diagnosis not present

## 2022-04-07 DIAGNOSIS — M545 Low back pain, unspecified: Secondary | ICD-10-CM | POA: Diagnosis not present

## 2022-04-07 DIAGNOSIS — M4316 Spondylolisthesis, lumbar region: Secondary | ICD-10-CM | POA: Diagnosis not present

## 2022-04-07 DIAGNOSIS — Z8489 Family history of other specified conditions: Secondary | ICD-10-CM | POA: Diagnosis not present

## 2022-04-12 DIAGNOSIS — D0462 Carcinoma in situ of skin of left upper limb, including shoulder: Secondary | ICD-10-CM | POA: Diagnosis not present

## 2022-05-23 DIAGNOSIS — M810 Age-related osteoporosis without current pathological fracture: Secondary | ICD-10-CM | POA: Diagnosis not present

## 2022-05-23 DIAGNOSIS — I10 Essential (primary) hypertension: Secondary | ICD-10-CM | POA: Diagnosis not present

## 2022-05-23 DIAGNOSIS — N3941 Urge incontinence: Secondary | ICD-10-CM | POA: Diagnosis not present

## 2022-05-23 DIAGNOSIS — R011 Cardiac murmur, unspecified: Secondary | ICD-10-CM | POA: Diagnosis not present

## 2022-05-23 DIAGNOSIS — K219 Gastro-esophageal reflux disease without esophagitis: Secondary | ICD-10-CM | POA: Diagnosis not present

## 2022-05-23 DIAGNOSIS — L719 Rosacea, unspecified: Secondary | ICD-10-CM | POA: Diagnosis not present

## 2022-05-23 DIAGNOSIS — M5430 Sciatica, unspecified side: Secondary | ICD-10-CM | POA: Diagnosis not present

## 2022-05-23 DIAGNOSIS — Z0000 Encounter for general adult medical examination without abnormal findings: Secondary | ICD-10-CM | POA: Diagnosis not present

## 2022-05-23 DIAGNOSIS — E7800 Pure hypercholesterolemia, unspecified: Secondary | ICD-10-CM | POA: Diagnosis not present

## 2022-05-23 DIAGNOSIS — E039 Hypothyroidism, unspecified: Secondary | ICD-10-CM | POA: Diagnosis not present

## 2022-05-23 DIAGNOSIS — C4492 Squamous cell carcinoma of skin, unspecified: Secondary | ICD-10-CM | POA: Diagnosis not present

## 2022-05-23 DIAGNOSIS — J309 Allergic rhinitis, unspecified: Secondary | ICD-10-CM | POA: Diagnosis not present

## 2022-06-03 DIAGNOSIS — R011 Cardiac murmur, unspecified: Secondary | ICD-10-CM | POA: Diagnosis not present

## 2022-07-07 ENCOUNTER — Ambulatory Visit: Payer: Medicare Other | Attending: Internal Medicine

## 2022-07-07 ENCOUNTER — Other Ambulatory Visit: Payer: Self-pay

## 2022-07-07 DIAGNOSIS — N393 Stress incontinence (female) (male): Secondary | ICD-10-CM | POA: Insufficient documentation

## 2022-07-07 DIAGNOSIS — M6281 Muscle weakness (generalized): Secondary | ICD-10-CM | POA: Diagnosis not present

## 2022-07-07 DIAGNOSIS — N3941 Urge incontinence: Secondary | ICD-10-CM | POA: Diagnosis not present

## 2022-07-07 DIAGNOSIS — R3915 Urgency of urination: Secondary | ICD-10-CM | POA: Insufficient documentation

## 2022-07-07 DIAGNOSIS — R279 Unspecified lack of coordination: Secondary | ICD-10-CM | POA: Insufficient documentation

## 2022-07-07 NOTE — Patient Instructions (Signed)
Urge Incontinence  Ideal urination frequency is every 2-4 wakeful hours, which equates to 5-8 times within a 24-hour period.   Urge incontinence is leakage that occurs when the bladder muscle contracts, creating a sudden need to go before getting to the bathroom.   Going too often when your bladder isn't actually full can disrupt the body's automatic signals to store and hold urine longer, which will increase urgency/frequency.  In this case, the bladder "is running the show" and strategies can be learned to retrain this pattern.   One should be able to control the first urge to urinate, at around 150mL.  The bladder can hold up to a "grande latte," or 400mL. To help you gain control, practice the Urge Drill below when urgency strikes.  This drill will help retrain your bladder signals and allow you to store and hold urine longer.  The overall goal is to stretch out your time between voids to reach a more manageable voiding schedule.    Practice your "quick flicks" often throughout the day (each waking hour) even when you don't need feel the urge to go.  This will help strengthen your pelvic floor muscles, making them more effective in controlling leakage.  Urge Drill  When you feel an urge to go, follow these steps to regain control: Stop what you are doing and be still Take one deep breath, directing your air into your abdomen Think an affirming thought, such as "I've got this." Do 5 quick flicks of your pelvic floor Walk with control to the bathroom to void, or delay voiding        The knack: Use this technique while coughing, laughing, sneezing, or with any activities that causes you to leak urine a little. Right before you perform one of these activities that increase pressure in the abdomen and pushes a little urine out, perform a pelvic floor muscle contraction and hold. If that does not completely stop the leaking, try tightening your thighs together in addition to performing a  pelvic floor muscle contraction. Make sure you are not trying to stifle a cough, sneeze, or laugh; allow these activities in full as it will cause less pressure down into the bladder and pelvic floor muscles.       [facility] [address], [address] [place], [place] [address] Phone # [phone] Fax [phone]  

## 2022-07-07 NOTE — Therapy (Signed)
OUTPATIENT PHYSICAL THERAPY FEMALE PELVIC EVALUATION   Patient Name: Laurie Vazquez MRN: [id number] DOB:[birth year], 82 y.o., female Today's Date: 07/07/2022  END OF SESSION:  PT End of Session - 07/07/22 0932     Visit Number 1    Date for PT Re-Evaluation 09/15/22    Authorization Type Medicare    PT Start Time 0930    PT Stop Time 1010    PT Time Calculation (min) 40 min    Activity Tolerance Patient tolerated treatment well    Behavior During Therapy [facility] for tasks assessed/performed             History reviewed. No pertinent past medical history. History reviewed. No pertinent surgical history. There are no problems to display for this patient.   PCP: Maurice Small, MD   REFERRING PROVIDER: Ollen Bowl, MD  REFERRING DIAG: 39.41 (ICD-10-CM) - Urge incontinence   THERAPY DIAG:  Urinary urgency  Urge incontinence  Stress incontinence (female) (female)  Muscle weakness (generalized)  Unspecified lack of coordination  Rationale for Evaluation and Treatment: Rehabilitation  ONSET DATE: 8 months ago  SUBJECTIVE:                                                                                                                                                                                           EVAL SUBJECTIVE STATEMENT: Pt states that she has had SUI with coughing/sneezing/laughing for 8 months. She also had one episode of urinary incontinence when standing up without warning; she was able to stop the flow of urine. She will sometimes have low back pain and states that she has a L5 bulging disc.  Fluid intake: Yes: tries to drink a lot of water, sometimes soda, coffee    PAIN:  Are you having pain? No  PRECAUTIONS: None  WEIGHT BEARING RESTRICTIONS: No  FALLS:  Has patient fallen in last 6 months? No  LIVING ENVIRONMENT: Lives with: lives with their family Lives in: House/apartment  OCCUPATION: retired  PLOF: Independent  PATIENT GOALS:  decrease urinary leakage  PERTINENT HISTORY:  Osteoporosis, cholecystectomy, tubal ligation Sexual abuse: No  BOWEL MOVEMENT: Pain with bowel movement: No Type of bowel movement:Frequency most of the time dialy bowel movement and Strain No - sometimes  Fully empty rectum: Yes: most of the time Leakage: No, but severe urgency when she takes stool softener Pads: No Fiber supplement: No  URINATION: Pain with urination: No Fully empty bladder: Yes: sits for a long time to make sure - will strain in the middle of the night to make sure she is emptying Stream: Strong, but will stop with urine still in  bladder and then she will strain to get more out Urgency: Yes: can't walk past bathroom without going Frequency: 3x/night, very often - can't walk past a bathroom during the day Leakage: Urge to void, Walking to the bathroom, Coughing, Sneezing, and Laughing Pads: Yes: panty liner just in case, sometimes   INTERCOURSE: Pain with intercourse:  not sexually active, no history of pain  PREGNANCY: Vaginal deliveries 4 Tearing Yes: with sutures    PROLAPSE: None   OBJECTIVE:  07/07/22:  COGNITION: Overall cognitive status: Within functional limits for tasks assessed     SENSATION: Light touch: Appears intact Proprioception: Appears intact  FUNCTIONAL TESTS: difficulty with chin up test  GAIT: Comments: WNL  POSTURE: posterior pelvic tilt   PALPATION:   General  urgency with palpation of bladder                 External Perineal Exam dryness                             Internal Pelvic Floor dryness, tenderness surrounding introitus   Patient confirms identification and approves PT to assess internal pelvic floor and treatment Yes  PELVIC MMT:   MMT eval  Vaginal 1-2/5, 4 repeat contractions, 4 second endurance  Internal Anal Sphincter   External Anal Sphincter   Puborectalis   Diastasis Recti 2 finger width separation at umbilicus  (Blank rows = not tested)         TONE: WNL  PROLAPSE: WNL  TODAY'S TREATMENT:                                                                                                                              DATE:  07/07/22  EVAL  Neuromuscular re-education: Pelvic floor contraction training in supine Long holds Quick flicks  Urge drill The knack Therapeutic activities: Urge drill The knack Vulvovaginal massage with coconut oil     PATIENT EDUCATION:  Education details: see above Person educated: Patient Education method: Explanation, Demonstration, Tactile cues, Verbal cues, and Handouts Education comprehension: verbalized understanding  HOME EXERCISE PROGRAM: [id number]  ASSESSMENT:  CLINICAL IMPRESSION: Patient is a 82  y.o. female who was seen today for physical therapy evaluation and treatment for urinary incontinence. Exam findings notable for pelvic floor weakness and decreased endurance, difficulty with pelvic floor contraction coordination that is worse with repeated contraction, core weakness, and evidence of superficial vulvar atrophy with tenderness. Signs and symptoms are most consistent with poor pelvic floor strength and coordination in addition to age related changes of vulvar and vaginal tissue. Initial treatment consisted of pelvic floor contraction training, urge drill, the knack, and education on vulvar moisturizing. She will continue to benefit form skilled PT intervention in order to decrease urinary incontinence, improve urgency, and progress strengthening program to address all impairments.   OBJECTIVE IMPAIRMENTS: decreased activity tolerance, decreased coordination, decreased endurance, decreased strength, increased fascial restrictions, increased muscle  spasms, postural dysfunction, and pain.   ACTIVITY LIMITATIONS: continence  PARTICIPATION LIMITATIONS: community activity  PERSONAL FACTORS: 1 comorbidity: medical history  are also affecting patient's functional outcome.   REHAB  POTENTIAL: Good  CLINICAL DECISION MAKING: Stable/uncomplicated  EVALUATION COMPLEXITY: Low   GOALS: Goals reviewed with patient? Yes  SHORT TERM GOALS: Target date: 08/18/22  Pt will be independent with HEP.   Baseline: Goal status: INITIAL  2.  Pt will be independent with the knack, urge suppression technique, and double voiding in order to improve bladder habits and decrease urinary incontinence.   Baseline:  Goal status: INITIAL  3.  Pt will be independent with use of squatty potty, relaxed toileting mechanics, and improved bowel movement techniques in order to increase ease of bowel movements and complete evacuation.   Baseline:  Goal status: INITIAL   LONG TERM GOALS: Target date: 09/15/22  Pt will be independent with advanced HEP.   Baseline:  Goal status: INITIAL  2.  Pt will demonstrate normal pelvic floor muscle tone and A/ROM, able to achieve 4/5 strength with contractions and 10 sec endurance, in order to provide appropriate lumbopelvic support in functional activities.   Baseline:  Goal status: INITIAL  3.  Pt will be able to go 2-3 hours in between voids without urgency or incontinence in order to improve QOL and perform all functional activities with less difficulty.   Baseline:  Goal status: INITIAL  4.  Pt will report no leaks with laughing, coughing, sneezing in order to improve comfort with interpersonal relationships and community activities.   Baseline:  Goal status: INITIAL  5.  Pt will decrease frequency of nightly trips to the bathroom to 1 or less in order to get restful sleep.   Baseline:  Goal status: INITIAL   PLAN:  PT FREQUENCY: 1-2x/week  PT DURATION: 10 weeks  PLANNED INTERVENTIONS: Therapeutic exercises, Therapeutic activity, Neuromuscular re-education, Balance training, Gait training, Patient/Family education, Self Care, Joint mobilization, Dry Needling, Biofeedback, and Manual therapy  PLAN FOR NEXT SESSION: Progress  pelvic floor strengthening to standing positions; begin core training; techniques to help improve bowel movements; double voiding to make sure bladder is emptying; possible bladder retraining.    Julio Alm, PT, DPT04/18/2410:19 AM

## 2022-07-12 ENCOUNTER — Ambulatory Visit: Payer: Medicare Other

## 2022-07-12 DIAGNOSIS — R279 Unspecified lack of coordination: Secondary | ICD-10-CM | POA: Diagnosis not present

## 2022-07-12 DIAGNOSIS — M6281 Muscle weakness (generalized): Secondary | ICD-10-CM

## 2022-07-12 DIAGNOSIS — N3941 Urge incontinence: Secondary | ICD-10-CM | POA: Diagnosis not present

## 2022-07-12 DIAGNOSIS — N393 Stress incontinence (female) (male): Secondary | ICD-10-CM

## 2022-07-12 DIAGNOSIS — R3915 Urgency of urination: Secondary | ICD-10-CM

## 2022-07-12 NOTE — Therapy (Signed)
OUTPATIENT PHYSICAL THERAPY TREATMENT NOTE   Patient Name: Laurie Vazquez MRN: [id number] DOB:1941/02/04, 82 y.o., female Today's Date: 07/12/2022  PCP: Maurice Small, MD REFERRING PROVIDER: Ollen Bowl, MD  END OF SESSION:   PT End of Session - 07/12/22 0930     Visit Number 2    Date for PT Re-Evaluation 09/15/22    Authorization Type Medicare    PT Start Time 0930    PT Stop Time 1010    PT Time Calculation (min) 40 min    Activity Tolerance Patient tolerated treatment well    Behavior During Therapy [facility] for tasks assessed/performed             History reviewed. No pertinent past medical history. History reviewed. No pertinent surgical history. There are no problems to display for this patient.   REFERRING DIAG: N39.41 (ICD-10-CM) - Urge incontinence  THERAPY DIAG:  Urinary urgency  Urge incontinence  Stress incontinence (female) (female)  Muscle weakness (generalized)  Unspecified lack of coordination  Rationale for Evaluation and Treatment Rehabilitation  PERTINENT HISTORY: Osteoporosis, cholecystectomy, tubal ligation   PRECAUTIONS: NA  SUBJECTIVE:                                                                                                                                                                                      SUBJECTIVE STATEMENT:  Pt states that she has been trying to hold bladder for 2 hours. She has not been using the urge suppression technique to do this, but just trying to hold.    PAIN:  Are you having pain? No  EVAL SUBJECTIVE STATEMENT: Pt states that she has had SUI with coughing/sneezing/laughing for 8 months. She also had one episode of urinary incontinence when standing up without warning; she was able to stop the flow of urine. She will sometimes have low back pain and states that she has a L5 bulging disc.  Fluid intake: Yes: tries to drink a lot of water, sometimes soda, coffee   PAIN:  Are you having pain?  No  PRECAUTIONS: None  WEIGHT BEARING RESTRICTIONS: No  FALLS:  Has patient fallen in last 6 months? No  LIVING ENVIRONMENT: Lives with: lives with their family Lives in: House/apartment  OCCUPATION: retired  PLOF: Independent  PATIENT GOALS: decrease urinary leakage  PERTINENT HISTORY:  Osteoporosis, cholecystectomy, tubal ligation Sexual abuse: No  BOWEL MOVEMENT: Pain with bowel movement: No Type of bowel movement:Frequency most of the time dialy bowel movement and Strain No - sometimes  Fully empty rectum: Yes: most of the time Leakage: No, but severe urgency when she takes stool softener Pads: No Fiber supplement: No  URINATION:  Pain with urination: No Fully empty bladder: Yes: sits for a long time to make sure - will strain in the middle of the night to make sure she is emptying Stream: Strong, but will stop with urine still in bladder and then she will strain to get more out Urgency: Yes: can't walk past bathroom without going Frequency: 3x/night, very often - can't walk past a bathroom during the day Leakage: Urge to void, Walking to the bathroom, Coughing, Sneezing, and Laughing Pads: Yes: panty liner just in case, sometimes   INTERCOURSE: Pain with intercourse: not sexually active, no history of pain  PREGNANCY: Vaginal deliveries 4 Tearing Yes: with sutures    PROLAPSE: None   OBJECTIVE:  07/07/22:  COGNITION: Overall cognitive status: Within functional limits for tasks assessed     SENSATION: Light touch: Appears intact Proprioception: Appears intact  FUNCTIONAL TESTS: difficulty with chin up test  GAIT: Comments: WNL  POSTURE: posterior pelvic tilt   PALPATION:   General  urgency with palpation of bladder                 External Perineal Exam dryness                             Internal Pelvic Floor dryness, tenderness surrounding introitus   Patient confirms identification and approves PT to assess internal pelvic floor and  treatment Yes  PELVIC MMT:   MMT eval  Vaginal 1-2/5, 4 repeat contractions, 4 second endurance  Internal Anal Sphincter   External Anal Sphincter   Puborectalis   Diastasis Recti 2 finger width separation at umbilicus  (Blank rows = not tested)        TONE: WNL  PROLAPSE: WNL  TODAY'S TREATMENT:                                                                                                                              DATE:  07/11/22 Neuromuscular re-education: Pelvic floor contractions in standing Quick flicks in Regular,wide, and staggered stance Transversus abdominus training with multimodal cues for improved motor control and breath coordination Supine UE ball press 2 x 10 with multimodal coes for improved core activation without bearing down Supine march 2 x 10 Hooklying hip adduction isometric 10x Bridge with hip adduction 2 x 10 Side lying UE ball press Therapeutic activities: Exercise app Review urge drill and how to use in order to meet voiding window   07/07/22  EVAL  Neuromuscular re-education: Pelvic floor contraction training in supine Long holds Quick flicks  Urge drill The knack Therapeutic activities: Urge drill The knack Vulvovaginal massage with coconut oil     PATIENT EDUCATION:  Education details: see above Person educated: Patient Education method: Explanation, Demonstration, Tactile cues, Verbal cues, and Handouts Education comprehension: verbalized understanding  HOME EXERCISE PROGRAM: [id number]  ASSESSMENT:  CLINICAL IMPRESSION: Pt able to start pelvic floor strengthening in standing with good  success; she did report it being harder, but was able to achieve good contractions. We started core training and progressions in addition to gentle hip strengthening; all performed with appropriate breath coordination for pressure management. Pt with strong tendency to breath hold and bear down with core contraction, but excellent improvements  made. She will continue to benefit form skilled PT intervention in order to decrease urinary incontinence, improve urgency, and progress strengthening program to address all impairments.   OBJECTIVE IMPAIRMENTS: decreased activity tolerance, decreased coordination, decreased endurance, decreased strength, increased fascial restrictions, increased muscle spasms, postural dysfunction, and pain.   ACTIVITY LIMITATIONS: continence  PARTICIPATION LIMITATIONS: community activity  PERSONAL FACTORS: 1 comorbidity: medical history are also affecting patient's functional outcome.   REHAB POTENTIAL: Good  CLINICAL DECISION MAKING: Stable/uncomplicated  EVALUATION COMPLEXITY: Low   GOALS: Goals reviewed with patient? Yes  SHORT TERM GOALS: Target date: 08/18/22  Pt will be independent with HEP.   Baseline: Goal status: INITIAL  2.  Pt will be independent with the knack, urge suppression technique, and double voiding in order to improve bladder habits and decrease urinary incontinence.   Baseline:  Goal status: INITIAL  3.  Pt will be independent with use of squatty potty, relaxed toileting mechanics, and improved bowel movement techniques in order to increase ease of bowel movements and complete evacuation.   Baseline:  Goal status: INITIAL   LONG TERM GOALS: Target date: 09/15/22  Pt will be independent with advanced HEP.   Baseline:  Goal status: INITIAL  2.  Pt will demonstrate normal pelvic floor muscle tone and A/ROM, able to achieve 4/5 strength with contractions and 10 sec endurance, in order to provide appropriate lumbopelvic support in functional activities.   Baseline:  Goal status: INITIAL  3.  Pt will be able to go 2-3 hours in between voids without urgency or incontinence in order to improve QOL and perform all functional activities with less difficulty.   Baseline:  Goal status: INITIAL  4.  Pt will report no leaks with laughing, coughing, sneezing in order to  improve comfort with interpersonal relationships and community activities.   Baseline:  Goal status: INITIAL  5.  Pt will decrease frequency of nightly trips to the bathroom to 1 or less in order to get restful sleep.   Baseline:  Goal status: INITIAL   PLAN:  PT FREQUENCY: 1-2x/week  PT DURATION: 10 weeks  PLANNED INTERVENTIONS: Therapeutic exercises, Therapeutic activity, Neuromuscular re-education, Balance training, Gait training, Patient/Family education, Self Care, Joint mobilization, Dry Needling, Biofeedback, and Manual therapy  PLAN FOR NEXT SESSION: techniques to help improve bowel movements; double voiding to make sure bladder is emptying; possible bladder retraining; incorporate core/pelvic floor into current exercises.   Julio Alm, PT, DPT04/23/249:30 AM

## 2022-07-21 DIAGNOSIS — M810 Age-related osteoporosis without current pathological fracture: Secondary | ICD-10-CM | POA: Diagnosis not present

## 2022-07-21 DIAGNOSIS — M545 Low back pain, unspecified: Secondary | ICD-10-CM | POA: Diagnosis not present

## 2022-07-21 DIAGNOSIS — T148XXA Other injury of unspecified body region, initial encounter: Secondary | ICD-10-CM | POA: Diagnosis not present

## 2022-07-21 DIAGNOSIS — W19XXXA Unspecified fall, initial encounter: Secondary | ICD-10-CM | POA: Diagnosis not present

## 2022-08-24 ENCOUNTER — Ambulatory Visit: Payer: Medicare Other | Attending: Internal Medicine

## 2022-08-24 DIAGNOSIS — M6281 Muscle weakness (generalized): Secondary | ICD-10-CM | POA: Insufficient documentation

## 2022-08-24 DIAGNOSIS — N393 Stress incontinence (female) (male): Secondary | ICD-10-CM | POA: Diagnosis not present

## 2022-08-24 DIAGNOSIS — R3915 Urgency of urination: Secondary | ICD-10-CM | POA: Diagnosis not present

## 2022-08-24 DIAGNOSIS — N3941 Urge incontinence: Secondary | ICD-10-CM | POA: Diagnosis not present

## 2022-08-24 DIAGNOSIS — R279 Unspecified lack of coordination: Secondary | ICD-10-CM | POA: Diagnosis not present

## 2022-08-24 NOTE — Therapy (Signed)
OUTPATIENT PHYSICAL THERAPY TREATMENT NOTE   Patient Name: Laurie Vazquez MRN: [id number] DOB:[birth year], 82 y.o., female Today's Date: 08/24/2022  PCP: Maurice Small, MD REFERRING PROVIDER: Ollen Bowl, MD  END OF SESSION:   PT End of Session - 08/24/22 1443     Visit Number 3    Date for PT Re-Evaluation 09/15/22    Authorization Type Medicare    PT Start Time 1445    PT Stop Time 1530    PT Time Calculation (min) 45 min    Activity Tolerance Patient tolerated treatment well    Behavior During Therapy [facility] for tasks assessed/performed             History reviewed. No pertinent past medical history. History reviewed. No pertinent surgical history. There are no problems to display for this patient.   REFERRING DIAG: N39.41 (ICD-10-CM) - Urge incontinence  THERAPY DIAG:  Urinary urgency  Urge incontinence  Stress incontinence (female) (female)  Muscle weakness (generalized)  Unspecified lack of coordination  Rationale for Evaluation and Treatment Rehabilitation  PERTINENT HISTORY: Osteoporosis, cholecystectomy, tubal ligation   PRECAUTIONS: NA  SUBJECTIVE:                                                                                                                                                                                      SUBJECTIVE STATEMENT:  Pt states that she feels like bladder is much stronger. She states that she sneezed this morning when standing and did not leak any urine.    PAIN:  Are you having pain? No  EVAL SUBJECTIVE STATEMENT: Pt states that she has had SUI with coughing/sneezing/laughing for 8 months. She also had one episode of urinary incontinence when standing up without warning; she was able to stop the flow of urine. She will sometimes have low back pain and states that she has a L5 bulging disc.  Fluid intake: Yes: tries to drink a lot of water, sometimes soda, coffee   PAIN:  Are you having pain? No  PRECAUTIONS:  None  WEIGHT BEARING RESTRICTIONS: No  FALLS:  Has patient fallen in last 6 months? No  LIVING ENVIRONMENT: Lives with: lives with their family Lives in: House/apartment  OCCUPATION: retired  PLOF: Independent  PATIENT GOALS: decrease urinary leakage  PERTINENT HISTORY:  Osteoporosis, cholecystectomy, tubal ligation Sexual abuse: No  BOWEL MOVEMENT: Pain with bowel movement: No Type of bowel movement:Frequency most of the time dialy bowel movement and Strain No - sometimes  Fully empty rectum: Yes: most of the time Leakage: No, but severe urgency when she takes stool softener Pads: No Fiber supplement: No  URINATION: Pain with urination: No Fully  empty bladder: Yes: sits for a long time to make sure - will strain in the middle of the night to make sure she is emptying Stream: Strong, but will stop with urine still in bladder and then she will strain to get more out Urgency: Yes: can't walk past bathroom without going Frequency: 3x/night, very often - can't walk past a bathroom during the day Leakage: Urge to void, Walking to the bathroom, Coughing, Sneezing, and Laughing Pads: Yes: panty liner just in case, sometimes   INTERCOURSE: Pain with intercourse: not sexually active, no history of pain  PREGNANCY: Vaginal deliveries 4 Tearing Yes: with sutures    PROLAPSE: None   OBJECTIVE:  07/07/22:  COGNITION: Overall cognitive status: Within functional limits for tasks assessed     SENSATION: Light touch: Appears intact Proprioception: Appears intact  FUNCTIONAL TESTS: difficulty with chin up test  GAIT: Comments: WNL  POSTURE: posterior pelvic tilt   PALPATION:   General  urgency with palpation of bladder                 External Perineal Exam dryness                             Internal Pelvic Floor dryness, tenderness surrounding introitus   Patient confirms identification and approves PT to assess internal pelvic floor and treatment  Yes  PELVIC MMT:   MMT eval  Vaginal 1-2/5, 4 repeat contractions, 4 second endurance  Internal Anal Sphincter   External Anal Sphincter   Puborectalis   Diastasis Recti 2 finger width separation at umbilicus  (Blank rows = not tested)        TONE: WNL  PROLAPSE: WNL  TODAY'S TREATMENT:                                                                                                                              DATE:  08/24/22 Neuromuscular re-education: Transversus abdominus training with multimodal cues for improved motor control and breath coordination Supine UE ball press 2 x 10 with multimodal coes for improved core activation without bearing down Supine march 2 x 10 Hooklying hip adduction isometric 10x Bridge with hip adduction 2 x 10 Side lying UE ball press Exercises: Piriformis stretch 60 sec bil Lower trunk rotation 3 x 10 Clam shells 2 x 10 bil   07/11/22 Neuromuscular re-education: Pelvic floor contractions in standing Quick flicks in Regular,wide, and staggered stance Transversus abdominus training with multimodal cues for improved motor control and breath coordination Supine UE ball press 2 x 10 with multimodal coes for improved core activation without bearing down Supine march 2 x 10 Hooklying hip adduction isometric 10x Bridge with hip adduction 2 x 10 Side lying UE ball press Therapeutic activities: Exercise app Review urge drill and how to use in order to meet voiding window   07/07/22  EVAL  Neuromuscular re-education: Pelvic floor contraction training in  supine Long holds Quick flicks  Urge drill The knack Therapeutic activities: Urge drill The knack Vulvovaginal massage with coconut oil     PATIENT EDUCATION:  Education details: see above Person educated: Patient Education method: Explanation, Demonstration, Tactile cues, Verbal cues, and Handouts Education comprehension: verbalized understanding  HOME EXERCISE  PROGRAM: [id number]  ASSESSMENT:  CLINICAL IMPRESSION: Since it has been a while since patient's last visit, we revisited core training; she had difficulty still with breath holding and appropriate coordination of when to exhale and contract. She also resulted back to bearing down and pushing out. She was able to correct with multimodal cues. She did very well with addition of mobility activities to make sure bil hip pain did not get exacerbated. Good tolerance to progressions of clam shells. She will continue to benefit form skilled PT intervention in order to decrease urinary incontinence, improve urgency, and progress strengthening program to address all impairments.   OBJECTIVE IMPAIRMENTS: decreased activity tolerance, decreased coordination, decreased endurance, decreased strength, increased fascial restrictions, increased muscle spasms, postural dysfunction, and pain.   ACTIVITY LIMITATIONS: continence  PARTICIPATION LIMITATIONS: community activity  PERSONAL FACTORS: 1 comorbidity: medical history are also affecting patient's functional outcome.   REHAB POTENTIAL: Good  CLINICAL DECISION MAKING: Stable/uncomplicated  EVALUATION COMPLEXITY: Low   GOALS: Goals reviewed with patient? Yes  SHORT TERM GOALS: Target date: 08/18/22 - updated 08/24/22  Pt will be independent with HEP.   Baseline: Goal status: IN PROGRESS  2.  Pt will be independent with the knack, urge suppression technique, and double voiding in order to improve bladder habits and decrease urinary incontinence.   Baseline:  Goal status: IN PROGRESS  3.  Pt will be independent with use of squatty potty, relaxed toileting mechanics, and improved bowel movement techniques in order to increase ease of bowel movements and complete evacuation.   Baseline:  Goal status: IN PROGRESS   LONG TERM GOALS: Target date: 09/15/22 - updated 08/24/22  Pt will be independent with advanced HEP.   Baseline:  Goal status: IN  PROGRESS  2.  Pt will demonstrate normal pelvic floor muscle tone and A/ROM, able to achieve 4/5 strength with contractions and 10 sec endurance, in order to provide appropriate lumbopelvic support in functional activities.   Baseline:  Goal status: IN PROGRESS  3.  Pt will be able to go 2-3 hours in between voids without urgency or incontinence in order to improve QOL and perform all functional activities with less difficulty.   Baseline:  Goal status: IN PROGRESS  4.  Pt will report no leaks with laughing, coughing, sneezing in order to improve comfort with interpersonal relationships and community activities.   Baseline:  Goal status: IN PROGRESS  5.  Pt will decrease frequency of nightly trips to the bathroom to 1 or less in order to get restful sleep.   Baseline:  Goal status: IN PROGRESS   PLAN:  PT FREQUENCY: 1-2x/week  PT DURATION: 10 weeks  PLANNED INTERVENTIONS: Therapeutic exercises, Therapeutic activity, Neuromuscular re-education, Balance training, Gait training, Patient/Family education, Self Care, Joint mobilization, Dry Needling, Biofeedback, and Manual therapy  PLAN FOR NEXT SESSION: continue to progress core/hip strengthening with pelvic floor activation  Julio Alm, PT, DPT06/05/243:27 PM

## 2022-08-31 ENCOUNTER — Ambulatory Visit: Payer: Medicare Other

## 2022-08-31 DIAGNOSIS — R3915 Urgency of urination: Secondary | ICD-10-CM

## 2022-08-31 DIAGNOSIS — R279 Unspecified lack of coordination: Secondary | ICD-10-CM | POA: Diagnosis not present

## 2022-08-31 DIAGNOSIS — N393 Stress incontinence (female) (male): Secondary | ICD-10-CM | POA: Diagnosis not present

## 2022-08-31 DIAGNOSIS — N3941 Urge incontinence: Secondary | ICD-10-CM

## 2022-08-31 DIAGNOSIS — M6281 Muscle weakness (generalized): Secondary | ICD-10-CM | POA: Diagnosis not present

## 2022-08-31 NOTE — Therapy (Signed)
OUTPATIENT PHYSICAL THERAPY TREATMENT NOTE   Patient Name: Laurie Vazquez MRN: [id number] DOB:March 22, 1940, 82 y.o., female Today's Date: 08/31/2022  PCP: Maurice Small, MD REFERRING PROVIDER: Ollen Bowl, MD  END OF SESSION:   PT End of Session - 08/31/22 1449     Visit Number 4    Date for PT Re-Evaluation 09/15/22    Authorization Type Medicare    PT Start Time 1445    PT Stop Time 1525    PT Time Calculation (min) 40 min    Activity Tolerance Patient tolerated treatment well    Behavior During Therapy [facility] for tasks assessed/performed             History reviewed. No pertinent past medical history. History reviewed. No pertinent surgical history. There are no problems to display for this patient.   REFERRING DIAG: N39.41 (ICD-10-CM) - Urge incontinence  THERAPY DIAG:  Urinary urgency  Urge incontinence  Stress incontinence (female) (female)  Muscle weakness (generalized)  Unspecified lack of coordination  Rationale for Evaluation and Treatment Rehabilitation  PERTINENT HISTORY: Osteoporosis, cholecystectomy, tubal ligation   PRECAUTIONS: NA  SUBJECTIVE:                                                                                                                                                                                      SUBJECTIVE STATEMENT:  Pt has had one episode with a sneeze and it was on her second sneeze - she states that it was very tiny and not even enough to get her pants wet. She has not been wearing a pad. She reports feeling prepared to D/C this session.   PAIN:  Are you having pain? No  EVAL SUBJECTIVE STATEMENT: Pt states that she has had SUI with coughing/sneezing/laughing for 8 months. She also had one episode of urinary incontinence when standing up without warning; she was able to stop the flow of urine. She will sometimes have low back pain and states that she has a L5 bulging disc.  Fluid intake: Yes: tries to drink a  lot of water, sometimes soda, coffee   PAIN:  Are you having pain? No  PRECAUTIONS: None  WEIGHT BEARING RESTRICTIONS: No  FALLS:  Has patient fallen in last 6 months? No  LIVING ENVIRONMENT: Lives with: lives with their family Lives in: House/apartment  OCCUPATION: retired  PLOF: Independent  PATIENT GOALS: decrease urinary leakage  PERTINENT HISTORY:  Osteoporosis, cholecystectomy, tubal ligation Sexual abuse: No  BOWEL MOVEMENT: Pain with bowel movement: No Type of bowel movement:Frequency most of the time dialy bowel movement and Strain No - sometimes  Fully empty rectum: Yes: most of the time Leakage:  No, but severe urgency when she takes stool softener Pads: No Fiber supplement: No  URINATION: Pain with urination: No Fully empty bladder: Yes: sits for a long time to make sure - will strain in the middle of the night to make sure she is emptying Stream: Strong, but will stop with urine still in bladder and then she will strain to get more out Urgency: Yes: can't walk past bathroom without going Frequency: 3x/night, very often - can't walk past a bathroom during the day Leakage: Urge to void, Walking to the bathroom, Coughing, Sneezing, and Laughing Pads: Yes: panty liner just in case, sometimes   INTERCOURSE: Pain with intercourse: not sexually active, no history of pain  PREGNANCY: Vaginal deliveries 4 Tearing Yes: with sutures    PROLAPSE: None   OBJECTIVE:  07/07/22:  COGNITION: Overall cognitive status: Within functional limits for tasks assessed     SENSATION: Light touch: Appears intact Proprioception: Appears intact  FUNCTIONAL TESTS: difficulty with chin up test  GAIT: Comments: WNL  POSTURE: posterior pelvic tilt   PALPATION:   General  urgency with palpation of bladder                 External Perineal Exam dryness                             Internal Pelvic Floor dryness, tenderness surrounding introitus   Patient confirms  identification and approves PT to assess internal pelvic floor and treatment Yes  PELVIC MMT:   MMT eval  Vaginal 1-2/5, 4 repeat contractions, 4 second endurance  Internal Anal Sphincter   External Anal Sphincter   Puborectalis   Diastasis Recti 2 finger width separation at umbilicus  (Blank rows = not tested)        TONE: WNL  PROLAPSE: WNL  TODAY'S TREATMENT:                                                                                                                              DATE:  08/31/22 Neuromuscular re-education: Seated hip adduction 15x Seated hip abduction yellow loop 15x Seated hip internal rotation yellow loop and yoga block 15x Resisted march yellow band 2 x 10 Therapeutic activities: Squats 10x Hip 3 way 10x ea, bil Standing shoulder extension 2 x 10 red band Pallof press 10x bil red band   08/24/22 Neuromuscular re-education: Transversus abdominus training with multimodal cues for improved motor control and breath coordination Supine UE ball press 2 x 10 with multimodal coes for improved core activation without bearing down Supine march 2 x 10 Hooklying hip adduction isometric 10x Bridge with hip adduction 2 x 10 Side lying UE ball press Exercises: Piriformis stretch 60 sec bil Lower trunk rotation 3 x 10 Clam shells 2 x 10 bil   07/11/22 Neuromuscular re-education: Pelvic floor contractions in standing Quick flicks in Regular,wide, and staggered stance Transversus abdominus training with multimodal  cues for improved motor control and breath coordination Supine UE ball press 2 x 10 with multimodal coes for improved core activation without bearing down Supine march 2 x 10 Hooklying hip adduction isometric 10x Bridge with hip adduction 2 x 10 Side lying UE ball press Therapeutic activities: Exercise app Review urge drill and how to use in order to meet voiding window      PATIENT EDUCATION:  Education details: see above Person  educated: Patient Education method: Explanation, Demonstration, Tactile cues, Verbal cues, and Handouts Education comprehension: verbalized understanding  HOME EXERCISE PROGRAM: [id number]  ASSESSMENT:  CLINICAL IMPRESSION: Pt did very well with all progressions to more functional positions today. She did much better with breath coordination and avoiding bearing down in abdomen/pelvic floor. Due to excellent progress and having almost completely met goals for being in pelvic floor physical therapy, pt is prepared to D/C at this time. She was encouraged to call with any questions or concerns.   OBJECTIVE IMPAIRMENTS: decreased activity tolerance, decreased coordination, decreased endurance, decreased strength, increased fascial restrictions, increased muscle spasms, postural dysfunction, and pain.   ACTIVITY LIMITATIONS: continence  PARTICIPATION LIMITATIONS: community activity  PERSONAL FACTORS: 1 comorbidity: medical history are also affecting patient's functional outcome.   REHAB POTENTIAL: Good  CLINICAL DECISION MAKING: Stable/uncomplicated  EVALUATION COMPLEXITY: Low   GOALS: Goals reviewed with patient? Yes  SHORT TERM GOALS: Target date: 08/18/22 - updated 08/24/22 - 08/31/23  Pt will be independent with HEP.   Baseline: Goal status: MET 08/31/22  2.  Pt will be independent with the knack, urge suppression technique, and double voiding in order to improve bladder habits and decrease urinary incontinence.   Baseline:  Goal status: MET 08/31/22  3.  Pt will be independent with use of squatty potty, relaxed toileting mechanics, and improved bowel movement techniques in order to increase ease of bowel movements and complete evacuation.   Baseline:  Goal status: MET 08/31/22   LONG TERM GOALS: Target date: 09/15/22 - updated 08/24/22 - updated 08/31/23  Pt will be independent with advanced HEP.   Baseline:  Goal status: MET 08/31/22  2.  Pt will demonstrate normal pelvic  floor muscle tone and A/ROM, able to achieve 4/5 strength with contractions and 10 sec endurance, in order to provide appropriate lumbopelvic support in functional activities.   Baseline:  Goal status: DISCHARGED no formal reassessment for pelvic floor today  3.  Pt will be able to go 2-3 hours in between voids without urgency or incontinence in order to improve QOL and perform all functional activities with less difficulty.   Baseline:  Goal status: MET 08/31/22  4.  Pt will report no leaks with laughing, coughing, sneezing in order to improve comfort with interpersonal relationships and community activities.   Baseline: 90% Goal status: PARTIALLY MET   5.  Pt will decrease frequency of nightly trips to the bathroom to 1 or less in order to get restful sleep.   Baseline:  Goal status: MET 08/31/22   PLAN:  PT FREQUENCY: -  PT DURATION: -  PLANNED INTERVENTIONS: -  PLAN FOR NEXT SESSION: DC  PHYSICAL THERAPY DISCHARGE SUMMARY  Visits from Start of Care: 4  Current functional level related to goals / functional outcomes: Independent   Remaining deficits: See above   Education / Equipment: HEP   Patient agrees to discharge. Patient goals were met. Patient is being discharged due to meeting the stated rehab goals.   Julio Alm, PT, DPT06/12/243:29 PM

## 2022-11-01 DIAGNOSIS — Z1231 Encounter for screening mammogram for malignant neoplasm of breast: Secondary | ICD-10-CM | POA: Diagnosis not present

## 2022-11-14 DIAGNOSIS — H26491 Other secondary cataract, right eye: Secondary | ICD-10-CM | POA: Diagnosis not present

## 2022-11-14 DIAGNOSIS — H43811 Vitreous degeneration, right eye: Secondary | ICD-10-CM | POA: Diagnosis not present

## 2022-11-14 DIAGNOSIS — Z961 Presence of intraocular lens: Secondary | ICD-10-CM | POA: Diagnosis not present

## 2022-11-14 DIAGNOSIS — H2512 Age-related nuclear cataract, left eye: Secondary | ICD-10-CM | POA: Diagnosis not present

## 2022-11-23 ENCOUNTER — Other Ambulatory Visit: Payer: Self-pay | Admitting: Family Medicine

## 2022-11-23 ENCOUNTER — Ambulatory Visit
Admission: RE | Admit: 2022-11-23 | Discharge: 2022-11-23 | Disposition: A | Discharge status: Home / Self Care | Payer: Medicare Other | Source: Ambulatory Visit | Attending: Family Medicine | Admitting: Family Medicine

## 2022-11-23 DIAGNOSIS — S0990XA Unspecified injury of head, initial encounter: Secondary | ICD-10-CM | POA: Diagnosis not present

## 2022-11-23 DIAGNOSIS — M25531 Pain in right wrist: Secondary | ICD-10-CM | POA: Diagnosis not present

## 2022-11-23 DIAGNOSIS — M818 Other osteoporosis without current pathological fracture: Secondary | ICD-10-CM | POA: Diagnosis not present

## 2022-11-25 DIAGNOSIS — I340 Nonrheumatic mitral (valve) insufficiency: Secondary | ICD-10-CM | POA: Diagnosis not present

## 2022-11-25 DIAGNOSIS — R152 Fecal urgency: Secondary | ICD-10-CM | POA: Diagnosis not present

## 2022-11-25 DIAGNOSIS — E7800 Pure hypercholesterolemia, unspecified: Secondary | ICD-10-CM | POA: Diagnosis not present

## 2022-11-25 DIAGNOSIS — K219 Gastro-esophageal reflux disease without esophagitis: Secondary | ICD-10-CM | POA: Diagnosis not present

## 2022-11-25 DIAGNOSIS — N3941 Urge incontinence: Secondary | ICD-10-CM | POA: Diagnosis not present

## 2022-11-25 DIAGNOSIS — I10 Essential (primary) hypertension: Secondary | ICD-10-CM | POA: Diagnosis not present

## 2022-11-25 DIAGNOSIS — E039 Hypothyroidism, unspecified: Secondary | ICD-10-CM | POA: Diagnosis not present

## 2022-11-25 DIAGNOSIS — M25531 Pain in right wrist: Secondary | ICD-10-CM | POA: Diagnosis not present

## 2022-11-25 DIAGNOSIS — M810 Age-related osteoporosis without current pathological fracture: Secondary | ICD-10-CM | POA: Diagnosis not present

## 2022-11-25 DIAGNOSIS — J309 Allergic rhinitis, unspecified: Secondary | ICD-10-CM | POA: Diagnosis not present

## 2022-11-25 DIAGNOSIS — C4492 Squamous cell carcinoma of skin, unspecified: Secondary | ICD-10-CM | POA: Diagnosis not present

## 2022-11-25 DIAGNOSIS — R159 Full incontinence of feces: Secondary | ICD-10-CM | POA: Diagnosis not present

## 2022-12-05 DIAGNOSIS — S52501A Unspecified fracture of the lower end of right radius, initial encounter for closed fracture: Secondary | ICD-10-CM | POA: Diagnosis not present

## 2022-12-22 DIAGNOSIS — I10 Essential (primary) hypertension: Secondary | ICD-10-CM | POA: Diagnosis not present

## 2022-12-22 DIAGNOSIS — Z23 Encounter for immunization: Secondary | ICD-10-CM | POA: Diagnosis not present

## 2022-12-26 DIAGNOSIS — M25531 Pain in right wrist: Secondary | ICD-10-CM | POA: Diagnosis not present

## 2022-12-26 DIAGNOSIS — S52501A Unspecified fracture of the lower end of right radius, initial encounter for closed fracture: Secondary | ICD-10-CM | POA: Diagnosis not present

## 2023-01-30 DIAGNOSIS — S52501D Unspecified fracture of the lower end of right radius, subsequent encounter for closed fracture with routine healing: Secondary | ICD-10-CM | POA: Diagnosis not present

## 2023-01-30 DIAGNOSIS — M25531 Pain in right wrist: Secondary | ICD-10-CM | POA: Diagnosis not present

## 2023-02-01 DIAGNOSIS — L820 Inflamed seborrheic keratosis: Secondary | ICD-10-CM | POA: Diagnosis not present

## 2023-02-01 DIAGNOSIS — L578 Other skin changes due to chronic exposure to nonionizing radiation: Secondary | ICD-10-CM | POA: Diagnosis not present

## 2023-02-01 DIAGNOSIS — D1722 Benign lipomatous neoplasm of skin and subcutaneous tissue of left arm: Secondary | ICD-10-CM | POA: Diagnosis not present

## 2023-02-01 DIAGNOSIS — D2261 Melanocytic nevi of right upper limb, including shoulder: Secondary | ICD-10-CM | POA: Diagnosis not present

## 2023-02-01 DIAGNOSIS — L719 Rosacea, unspecified: Secondary | ICD-10-CM | POA: Diagnosis not present

## 2023-02-01 DIAGNOSIS — L309 Dermatitis, unspecified: Secondary | ICD-10-CM | POA: Diagnosis not present

## 2023-02-01 DIAGNOSIS — D2272 Melanocytic nevi of left lower limb, including hip: Secondary | ICD-10-CM | POA: Diagnosis not present

## 2023-02-01 DIAGNOSIS — L821 Other seborrheic keratosis: Secondary | ICD-10-CM | POA: Diagnosis not present

## 2023-03-24 ENCOUNTER — Ambulatory Visit
Admission: RE | Admit: 2023-03-24 | Discharge: 2023-03-24 | Disposition: A | Discharge status: Home / Self Care | Payer: Medicare Other | Source: Ambulatory Visit | Attending: Internal Medicine | Admitting: Internal Medicine

## 2023-03-24 DIAGNOSIS — M810 Age-related osteoporosis without current pathological fracture: Secondary | ICD-10-CM

## 2023-03-24 DIAGNOSIS — M8588 Other specified disorders of bone density and structure, other site: Secondary | ICD-10-CM | POA: Diagnosis not present

## 2023-03-24 DIAGNOSIS — N958 Other specified menopausal and perimenopausal disorders: Secondary | ICD-10-CM | POA: Diagnosis not present

## 2023-03-24 DIAGNOSIS — E2839 Other primary ovarian failure: Secondary | ICD-10-CM | POA: Diagnosis not present

## 2023-03-24 DIAGNOSIS — M8580 Other specified disorders of bone density and structure, unspecified site: Secondary | ICD-10-CM

## 2023-04-19 DIAGNOSIS — M810 Age-related osteoporosis without current pathological fracture: Secondary | ICD-10-CM | POA: Diagnosis not present

## 2023-04-19 DIAGNOSIS — Z8489 Family history of other specified conditions: Secondary | ICD-10-CM | POA: Diagnosis not present

## 2023-04-19 DIAGNOSIS — Z8781 Personal history of (healed) traumatic fracture: Secondary | ICD-10-CM | POA: Diagnosis not present

## 2023-04-19 DIAGNOSIS — E039 Hypothyroidism, unspecified: Secondary | ICD-10-CM | POA: Diagnosis not present

## 2023-05-29 DIAGNOSIS — L719 Rosacea, unspecified: Secondary | ICD-10-CM | POA: Diagnosis not present

## 2023-05-29 DIAGNOSIS — I10 Essential (primary) hypertension: Secondary | ICD-10-CM | POA: Diagnosis not present

## 2023-05-29 DIAGNOSIS — M810 Age-related osteoporosis without current pathological fracture: Secondary | ICD-10-CM | POA: Diagnosis not present

## 2023-05-29 DIAGNOSIS — K219 Gastro-esophageal reflux disease without esophagitis: Secondary | ICD-10-CM | POA: Diagnosis not present

## 2023-05-29 DIAGNOSIS — E7800 Pure hypercholesterolemia, unspecified: Secondary | ICD-10-CM | POA: Diagnosis not present

## 2023-05-29 DIAGNOSIS — C4492 Squamous cell carcinoma of skin, unspecified: Secondary | ICD-10-CM | POA: Diagnosis not present

## 2023-05-29 DIAGNOSIS — Z0000 Encounter for general adult medical examination without abnormal findings: Secondary | ICD-10-CM | POA: Diagnosis not present

## 2023-05-29 DIAGNOSIS — E039 Hypothyroidism, unspecified: Secondary | ICD-10-CM | POA: Diagnosis not present

## 2023-05-29 DIAGNOSIS — N3941 Urge incontinence: Secondary | ICD-10-CM | POA: Diagnosis not present

## 2023-05-29 DIAGNOSIS — R152 Fecal urgency: Secondary | ICD-10-CM | POA: Diagnosis not present

## 2023-05-29 DIAGNOSIS — H6121 Impacted cerumen, right ear: Secondary | ICD-10-CM | POA: Diagnosis not present

## 2023-05-29 DIAGNOSIS — I340 Nonrheumatic mitral (valve) insufficiency: Secondary | ICD-10-CM | POA: Diagnosis not present

## 2023-05-30 DIAGNOSIS — H6123 Impacted cerumen, bilateral: Secondary | ICD-10-CM | POA: Diagnosis not present

## 2023-06-08 DIAGNOSIS — I10 Essential (primary) hypertension: Secondary | ICD-10-CM | POA: Diagnosis not present

## 2023-06-08 DIAGNOSIS — R159 Full incontinence of feces: Secondary | ICD-10-CM | POA: Diagnosis not present

## 2023-06-08 DIAGNOSIS — K9089 Other intestinal malabsorption: Secondary | ICD-10-CM | POA: Diagnosis not present

## 2023-06-08 DIAGNOSIS — Z860100 Personal history of colon polyps, unspecified: Secondary | ICD-10-CM | POA: Diagnosis not present

## 2023-07-18 DIAGNOSIS — H903 Sensorineural hearing loss, bilateral: Secondary | ICD-10-CM | POA: Diagnosis not present

## 2023-07-18 DIAGNOSIS — H9313 Tinnitus, bilateral: Secondary | ICD-10-CM | POA: Diagnosis not present

## 2023-08-18 IMAGING — CR DG FEMUR 2+V*L*
4 series · 4 of 4 positions shown · non-contrast
Comparison: None.

CLINICAL DATA: Age-related osteoporosis without current
pathological fracture. Left thigh pain.

EXAM:
LEFT FEMUR 2 VIEWS

[t femur with hip  ap left]
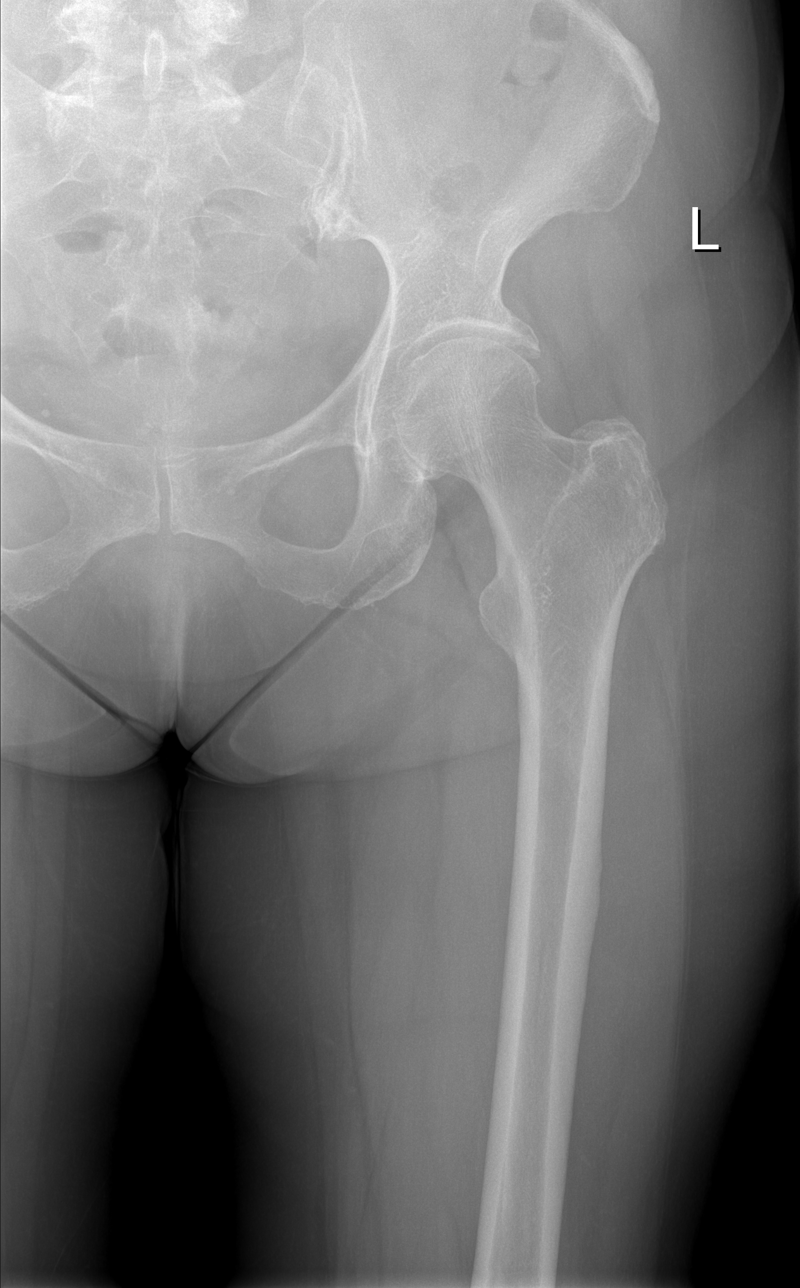

[t femur with knee ap left]
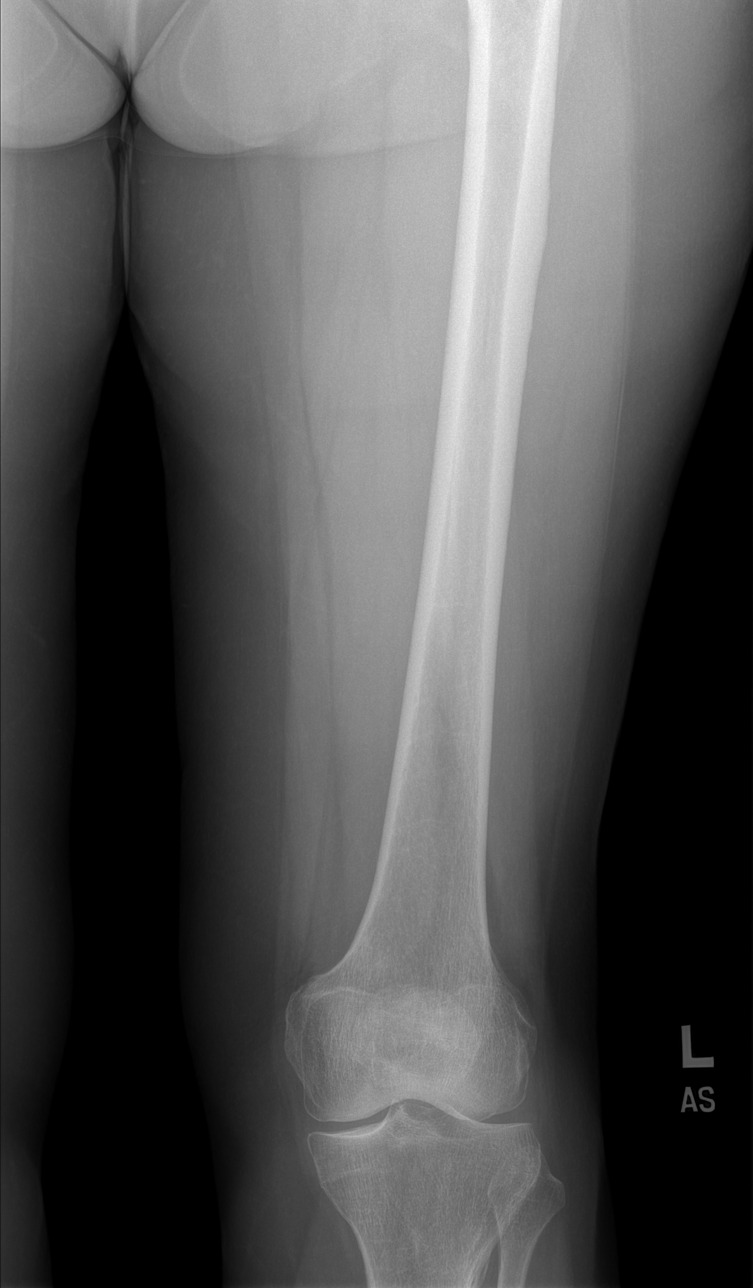

[t femur with hip lat left]
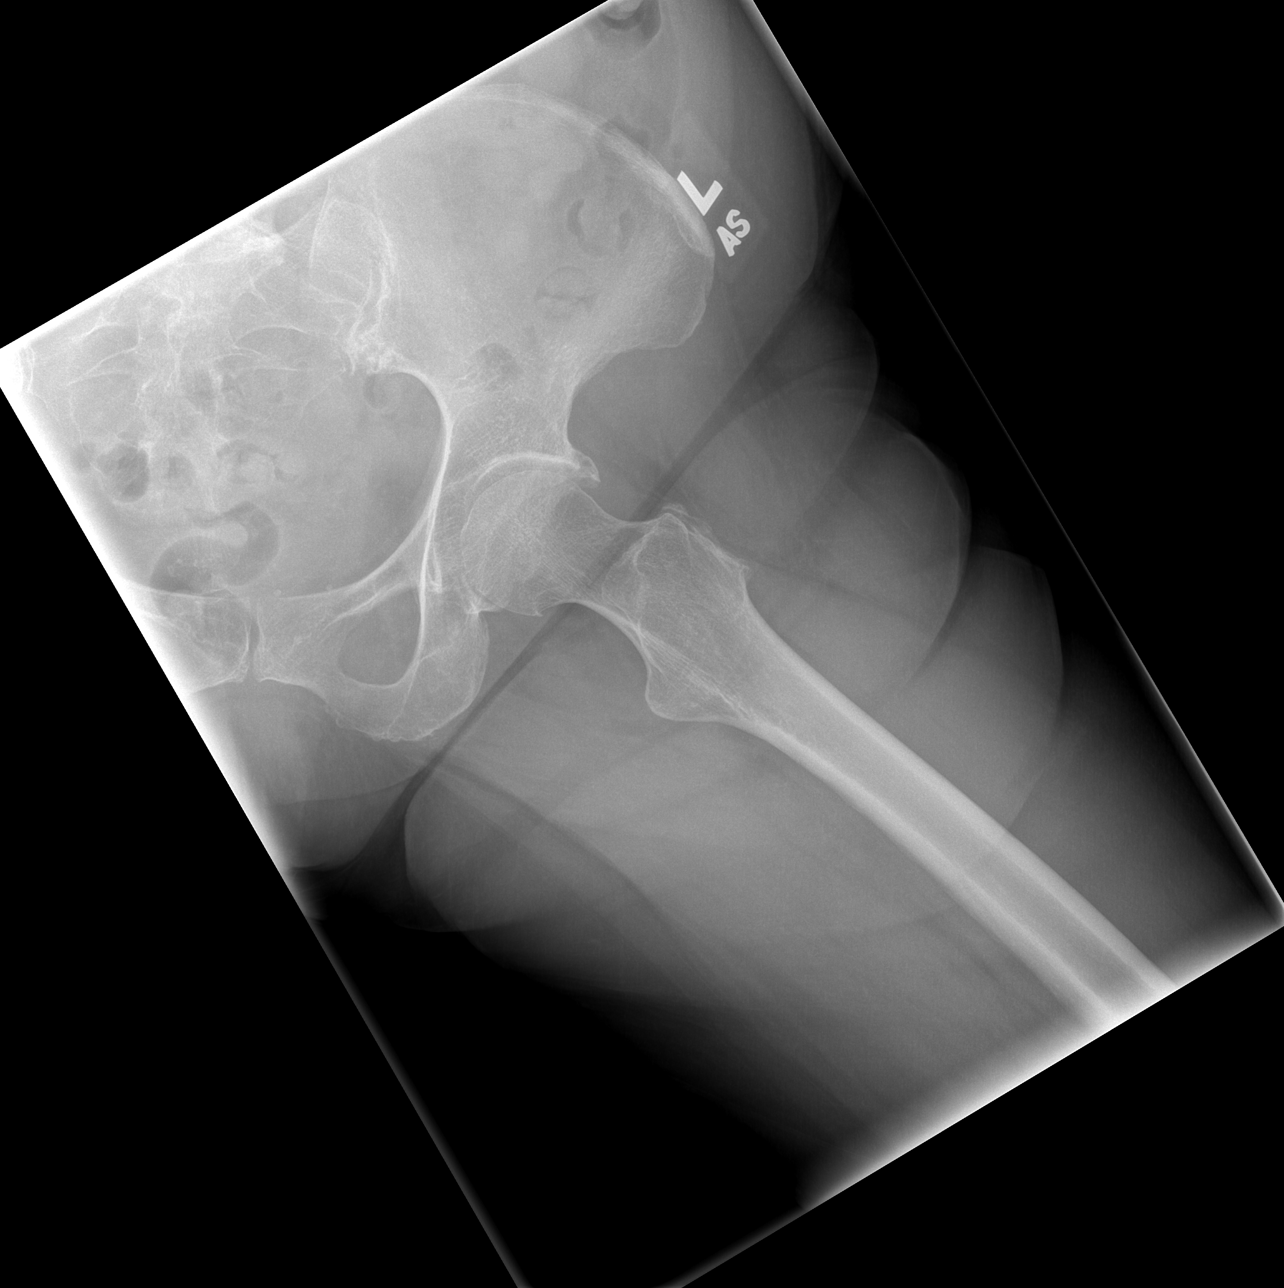

[t femur with knee lat left]
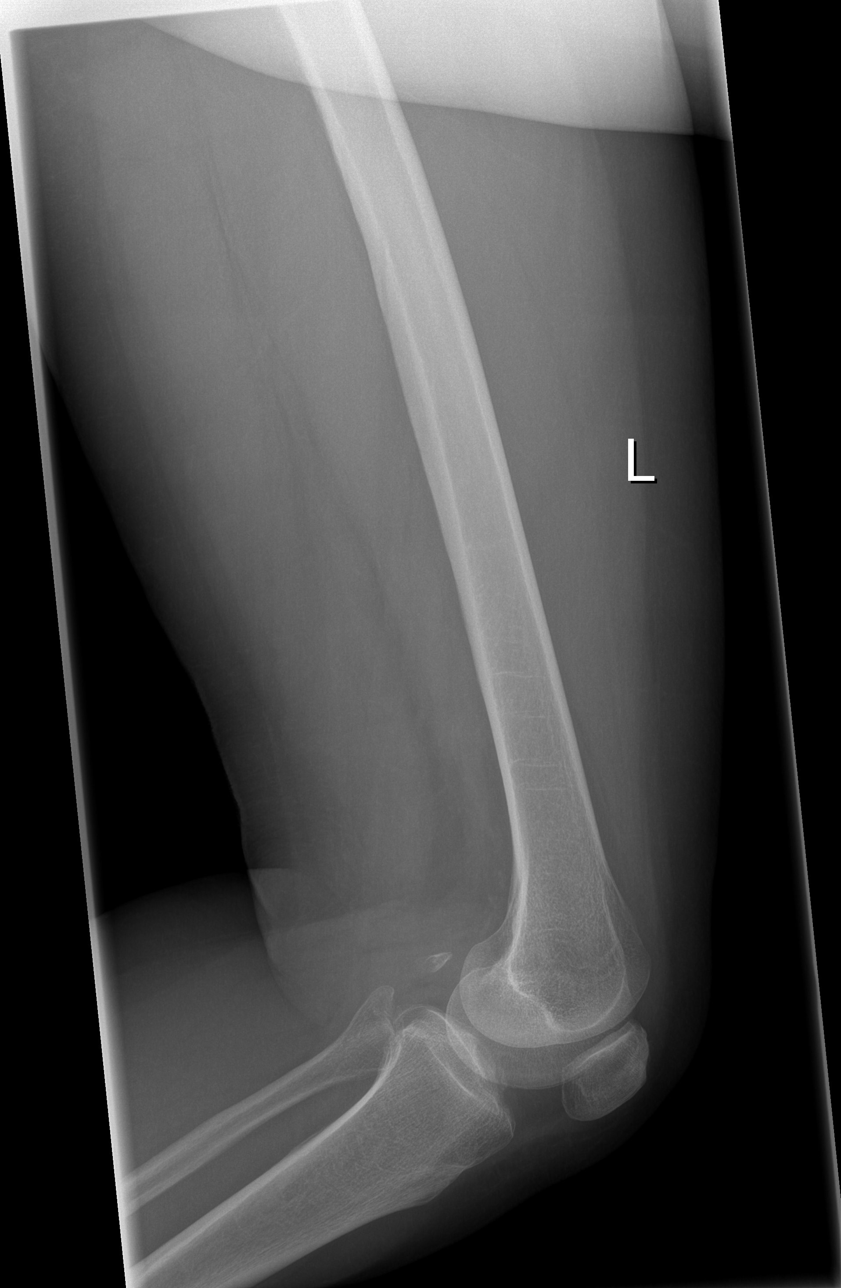

[4 of 4 positions shown; findings below may reference images not displayed]

FINDINGS: Mildly decreased bone mineralization. Mild superomedial left
femoroacetabular joint space narrowing. Mild peripheral left femoral
head-neck junction and moderate peripheral left acetabular
degenerative osteophytosis. Moderate inferior left sacroiliac joint
subchondral sclerosis degenerative change. Mild pubic symphysis
degenerative joint space narrowing and subchondral irregularity. No
knee joint effusion. No acute fracture or dislocation.
IMPRESSION: :
IMPRESSION: 1. Mild-to-moderate left femoroacetabular osteoarthritis.
2. Mild-to-moderate inferior left sacroiliac joint osteoarthritis.

## 2023-11-07 DIAGNOSIS — Z1231 Encounter for screening mammogram for malignant neoplasm of breast: Secondary | ICD-10-CM | POA: Diagnosis not present

## 2023-11-14 DIAGNOSIS — M791 Myalgia, unspecified site: Secondary | ICD-10-CM | POA: Diagnosis not present

## 2023-11-14 DIAGNOSIS — M25552 Pain in left hip: Secondary | ICD-10-CM | POA: Diagnosis not present

## 2023-11-14 DIAGNOSIS — M545 Low back pain, unspecified: Secondary | ICD-10-CM | POA: Diagnosis not present

## 2023-11-29 DIAGNOSIS — H35363 Drusen (degenerative) of macula, bilateral: Secondary | ICD-10-CM | POA: Diagnosis not present

## 2023-11-29 DIAGNOSIS — H401134 Primary open-angle glaucoma, bilateral, indeterminate stage: Secondary | ICD-10-CM | POA: Diagnosis not present

## 2023-11-29 DIAGNOSIS — H26491 Other secondary cataract, right eye: Secondary | ICD-10-CM | POA: Diagnosis not present

## 2023-11-29 DIAGNOSIS — H25812 Combined forms of age-related cataract, left eye: Secondary | ICD-10-CM | POA: Diagnosis not present

## 2023-11-30 DIAGNOSIS — K219 Gastro-esophageal reflux disease without esophagitis: Secondary | ICD-10-CM | POA: Diagnosis not present

## 2023-11-30 DIAGNOSIS — R32 Unspecified urinary incontinence: Secondary | ICD-10-CM | POA: Diagnosis not present

## 2023-11-30 DIAGNOSIS — I10 Essential (primary) hypertension: Secondary | ICD-10-CM | POA: Diagnosis not present

## 2023-11-30 DIAGNOSIS — M810 Age-related osteoporosis without current pathological fracture: Secondary | ICD-10-CM | POA: Diagnosis not present

## 2023-11-30 DIAGNOSIS — J309 Allergic rhinitis, unspecified: Secondary | ICD-10-CM | POA: Diagnosis not present

## 2023-11-30 DIAGNOSIS — M545 Low back pain, unspecified: Secondary | ICD-10-CM | POA: Diagnosis not present

## 2023-11-30 DIAGNOSIS — E7800 Pure hypercholesterolemia, unspecified: Secondary | ICD-10-CM | POA: Diagnosis not present

## 2023-11-30 DIAGNOSIS — I340 Nonrheumatic mitral (valve) insufficiency: Secondary | ICD-10-CM | POA: Diagnosis not present

## 2023-11-30 DIAGNOSIS — R152 Fecal urgency: Secondary | ICD-10-CM | POA: Diagnosis not present

## 2023-11-30 DIAGNOSIS — E039 Hypothyroidism, unspecified: Secondary | ICD-10-CM | POA: Diagnosis not present

## 2024-01-04 DIAGNOSIS — H401134 Primary open-angle glaucoma, bilateral, indeterminate stage: Secondary | ICD-10-CM | POA: Diagnosis not present

## 2024-01-10 DIAGNOSIS — E039 Hypothyroidism, unspecified: Secondary | ICD-10-CM | POA: Diagnosis not present

## 2024-01-11 DIAGNOSIS — Z23 Encounter for immunization: Secondary | ICD-10-CM | POA: Diagnosis not present

## 2024-01-11 DIAGNOSIS — R053 Chronic cough: Secondary | ICD-10-CM | POA: Diagnosis not present

## 2024-01-11 DIAGNOSIS — I10 Essential (primary) hypertension: Secondary | ICD-10-CM | POA: Diagnosis not present

## 2024-01-11 DIAGNOSIS — M810 Age-related osteoporosis without current pathological fracture: Secondary | ICD-10-CM | POA: Diagnosis not present

## 2024-02-01 DIAGNOSIS — D2261 Melanocytic nevi of right upper limb, including shoulder: Secondary | ICD-10-CM | POA: Diagnosis not present

## 2024-02-01 DIAGNOSIS — L578 Other skin changes due to chronic exposure to nonionizing radiation: Secondary | ICD-10-CM | POA: Diagnosis not present

## 2024-02-01 DIAGNOSIS — D1722 Benign lipomatous neoplasm of skin and subcutaneous tissue of left arm: Secondary | ICD-10-CM | POA: Diagnosis not present

## 2024-02-01 DIAGNOSIS — L820 Inflamed seborrheic keratosis: Secondary | ICD-10-CM | POA: Diagnosis not present

## 2024-02-01 DIAGNOSIS — L719 Rosacea, unspecified: Secondary | ICD-10-CM | POA: Diagnosis not present

## 2024-02-01 DIAGNOSIS — D2272 Melanocytic nevi of left lower limb, including hip: Secondary | ICD-10-CM | POA: Diagnosis not present

## 2024-02-01 DIAGNOSIS — L821 Other seborrheic keratosis: Secondary | ICD-10-CM | POA: Diagnosis not present

## 2024-02-01 DIAGNOSIS — L570 Actinic keratosis: Secondary | ICD-10-CM | POA: Diagnosis not present

## 2024-02-06 DIAGNOSIS — R053 Chronic cough: Secondary | ICD-10-CM | POA: Diagnosis not present

## 2024-02-06 DIAGNOSIS — E039 Hypothyroidism, unspecified: Secondary | ICD-10-CM | POA: Diagnosis not present

## 2024-02-06 DIAGNOSIS — K146 Glossodynia: Secondary | ICD-10-CM | POA: Diagnosis not present

## 2024-02-13 DIAGNOSIS — H26491 Other secondary cataract, right eye: Secondary | ICD-10-CM | POA: Diagnosis not present

## 2024-02-13 DIAGNOSIS — H401124 Primary open-angle glaucoma, left eye, indeterminate stage: Secondary | ICD-10-CM | POA: Diagnosis not present

## 2024-04-24 NOTE — Progress Notes (Unsigned)
 "  Cardiology [facility]:    Date:  04/25/2024   ID:  Laurie Vazquez, DOB Jul 25, 1940, MRN [id number]  PCP:  Vernon Velna SAUNDERS, MD  Cardiologist:  Ozell Fell, MD     Referring MD: Faythe Purchase, MD   Chief Complaint: chest pain and murmur   History of Present Illness:    Laurie Vazquez is a 84 y.o. female with a history of murmur, hypertension, hyperlipidemia, hypothyroidism, IBS, and open-angle glaucoma who presents today as a new patient in the [facility] for further evaluation of chest pain, cough, and murmur.  Patient was recently seen by PCP on 04/18/2024 at which time she reported a sharp chest pain. She also reported previously being told she had a murmur. Therefore, she was referred to Cardiology for further evaluation.  Patient reports a dry cough since at least 11/2023. Cough is worse a night. She was treated with Prednisone and Tessalon Perles a while ago with some improvement. She states she used to get bronchitis a log and the cough reminds her of that. She denies any shortness of breath with this. No orthopnea or PND. Her PCP recently checked a chest x-ray and it was normal. She had one episode of atypical chest pain on the evening of 04/17/2024. She states she had a sharp right sided chest pain when laying on her right side and then rolled onto her left side and had the same pain on her left side. She sat up and the pain went away. She denies any other episode chest pain except for a remote episode in 1973 after she had her 4th child. No exertional chest pain. She was having some palpitations back in summer of 2025 but none since then. No lightheadedness/ dizziness, syncope, or edema.    She states she was diagnosed with a murmur about a year ago. No other known cardiac history.   She tried cigarettes as a teenager but states she did not like them. No other tobacco use. She reports rare alcohol use and denies any drug use.   She does have a family history of CAD in  her father and a paternal aunt.   Reviewed Most Recent Labs from PCP's Office in 01/2024: - CBC: WBC 9.0, Hgb 13.4, Plts 289 - Iron panel: Iron 66, Transferrin 198 (low), Iron Binding 278, Iron Sat 24%, Vitamin B12 >1,500 (high), Folate >23.7 - Hemoglobin A1c 5.9 - TSH 1.07  EKGs/Labs/Other Studies Reviewed:    The following studies were reviewed:  Carotid Dopplers 10/10/2016: Color duplex indicates minimal heterogeneous plaque, with no hemodynamically significant stenosis by duplex criteria in the extracranial cerebrovascular circulation.  EKG:  EKG ordered today. EKG personally reviewed and demonstrates normal sinus rhythm, rate 74 bpm, with LVH but no acute ST/ T wave changes.  Recent Labs: No results found for requested labs within last 365 days.  Recent Lipid Panel No results found for: CHOL, TRIG, HDL, CHOLHDL, VLDL, LDLCALC, LDLDIRECT  Physical Exam:    Vital Signs: BP 116/83   Pulse 67   Ht 5' 2 (1.575 m)   Wt 146 lb (66.2 kg)   SpO2 98%   BMI 26.70 kg/m     Wt Readings from Last 3 Encounters:  04/25/24 146 lb (66.2 kg)     General: 84 y.o. Caucasian female in no acute distress. HEENT: Normocephalic and atraumatic. Sclera clear.  Neck: Supple. Soft carotid bruits noted bilaterally (suspect radiation from murmur). No JVD. Heart: RRR. III/VI systolic murmur. No gallops  or rubs.  Lungs: No increased work of breathing. Clear to ausculation bilaterally. No wheezes, rhonchi, or rales.  Extremities: No lower extremity edema.  Skin: Warm and dry. Neuro: No focal deficits. Psych: Normal affect. Responds appropriately.  Assessment:    1. Atypical chest pain   2. Murmur, cardiac   3. Bilateral carotid bruits   4. Chronic cough   5. Primary hypertension   6. Hyperlipidemia, unspecified hyperlipidemia type     Plan:    Atypical Chest Pain Patient reported one episode of atypical chest pain on 04/17/2024  that occurred as laying down as detailed  above. Pain improved when sitting up. No exertional chest pain. No other anginal symptoms. Chest pain sounds very atypical. Question whether this may of been musculoskeletal pain from coughing. I think we can hold off on any ischemic evaluation at this time and patient agrees. However, advised patient to let us  know if she has any recurrent chest pain.   Murmur Patient reports being diagnosed with a murmur about 1 year. It sounds like likely aortic stenosis on exam. Will order Echo.  Carotid Bruit Soft bilateral carotid bruits noted on exam. Suspect this is radiation from murmur. However, will order carotid dopplers.   Chronic Cough Patient states she has had a productive cough since 11/2023. She has a history of bronchitis and states it feels like this. Recent chest x-ray showed no acute findings. I don't think her cough is cardiac in nature. Will defer treatment of this to PCP.   Hypertension BP well controlled. Continue Amlodipine 2.5mg  twice daily and Atenolol 50mg  daily.   Hyperlipidemia Lipid panel in 05/2023: Total Cholesterol 169, Triglycerides 181, HDL 51, LDL 88. Continue Simvastatin 40mg  daily. Managed by PCP.  Disposition: Follow up in 3-4 months after Echo. Her husband sees Dr. Wonda and she would like to see him as well. Will have her get established with Dr. Wonda at next visit.    Signed, Roran Wegner E Boluwatife Mutchler, PA-C  04/25/2024 3:12 PM    [facility] "

## 2024-04-25 ENCOUNTER — Ambulatory Visit: Admitting: Student

## 2024-04-25 ENCOUNTER — Encounter: Payer: Self-pay | Admitting: Student

## 2024-04-25 VITALS — BP 116/83 | HR 67 | Ht 62.0 in | Wt 146.0 lb

## 2024-04-25 DIAGNOSIS — E785 Hyperlipidemia, unspecified: Secondary | ICD-10-CM

## 2024-04-25 DIAGNOSIS — R0789 Other chest pain: Secondary | ICD-10-CM

## 2024-04-25 DIAGNOSIS — R0989 Other specified symptoms and signs involving the circulatory and respiratory systems: Secondary | ICD-10-CM

## 2024-04-25 DIAGNOSIS — R011 Cardiac murmur, unspecified: Secondary | ICD-10-CM | POA: Diagnosis not present

## 2024-04-25 DIAGNOSIS — R053 Chronic cough: Secondary | ICD-10-CM

## 2024-04-25 DIAGNOSIS — I10 Essential (primary) hypertension: Secondary | ICD-10-CM | POA: Diagnosis not present

## 2024-04-25 NOTE — Patient Instructions (Addendum)
 Medication Instructions:  Your physician recommends that you continue on your current medications as directed. Please refer to the Current Medication list given to you today.  *If you need a refill on your cardiac medications before your next appointment, please call your pharmacy*  Lab Work: None ordered  If you have labs (blood work) drawn today and your tests are completely normal, you will receive your results only by: MyChart Message (if you have MyChart) OR A paper copy in the mail If you have any lab test that is abnormal or we need to change your treatment, we will call you to review the results.  Testing/Procedures: Your physician has requested that you have an echocardiogram. Echocardiography is a painless test that uses sound waves to create images of your heart. It provides your doctor with information about the size and shape of your heart and how well your hearts chambers and valves are working. This procedure takes approximately one hour. There are no restrictions for this procedure. Please do NOT wear cologne, perfume, aftershave, or lotions (deodorant is allowed). Please arrive 15 minutes prior to your appointment time.  Please note: We ask at that you not bring children with you during ultrasound (echo/ vascular) testing. Due to room size and safety concerns, children are not allowed in the ultrasound rooms during exams. Our front office staff cannot provide observation of children in our lobby area while testing is being conducted. An adult accompanying a patient to their appointment will only be allowed in the ultrasound room at the discretion of the ultrasound technician under special circumstances. We apologize for any inconvenience.  Your physician has requested that you have a carotid duplex. This test is an ultrasound of the carotid arteries in your neck. It looks at blood flow through these arteries that supply the brain with blood. Allow one hour for this exam. There  are no restrictions or special instructions.   Follow-Up: At [facility], you and your health needs are our priority.  As part of our continuing mission to provide you with exceptional heart care, our providers are all part of one team.  This team includes your primary Cardiologist (physician) and Advanced Practice Providers or APPs (Physician Assistants and Nurse Practitioners) who all work together to provide you with the care you need, when you need it.  Your next appointment:   As scheduled  Provider:   Ozell Fell, MD  We recommend signing up for the patient portal called MyChart.  Sign up information is provided on this After Visit Summary.  MyChart is used to connect with patients for Virtual Visits (Telemedicine).  Patients are able to view lab/test results, encounter notes, upcoming appointments, etc.  Non-urgent messages can be sent to your provider as well.   To learn more about what you can do with MyChart, go to [email or web address].   Other Instructions

## 2024-05-07 ENCOUNTER — Ambulatory Visit (HOSPITAL_COMMUNITY)

## 2024-06-04 ENCOUNTER — Ambulatory Visit (HOSPITAL_COMMUNITY)

## 2024-07-09 ENCOUNTER — Ambulatory Visit: Admitting: Cardiovascular Disease
